# Patient Record
Sex: Male | Born: 1955 | Race: Black or African American | Hispanic: No | Smoking: Current every day smoker
Health system: Southern US, Community
[De-identification: ages and names within clinical notes are randomized; demographics above are authoritative.]

## PROBLEM LIST (undated history)

## (undated) DIAGNOSIS — R569 Unspecified convulsions: Secondary | ICD-10-CM

## (undated) DIAGNOSIS — I10 Essential (primary) hypertension: Secondary | ICD-10-CM

## (undated) DIAGNOSIS — H539 Unspecified visual disturbance: Secondary | ICD-10-CM

## (undated) DIAGNOSIS — R413 Other amnesia: Secondary | ICD-10-CM

## (undated) DIAGNOSIS — F1010 Alcohol abuse, uncomplicated: Secondary | ICD-10-CM

## (undated) DIAGNOSIS — R251 Tremor, unspecified: Secondary | ICD-10-CM

## (undated) HISTORY — DX: Other amnesia: R41.3

## (undated) HISTORY — DX: Unspecified visual disturbance: H53.9

## (undated) HISTORY — PX: NEPHRECTOMY LIVING DONOR: SUR877

---

## 1999-01-16 ENCOUNTER — Emergency Department (HOSPITAL_COMMUNITY): Admission: EM | Admit: 1999-01-16 | Discharge: 1999-01-16 | Payer: Self-pay | Admitting: Emergency Medicine

## 2000-12-23 ENCOUNTER — Emergency Department (HOSPITAL_COMMUNITY): Admission: EM | Admit: 2000-12-23 | Discharge: 2000-12-24 | Payer: Self-pay

## 2000-12-31 ENCOUNTER — Emergency Department (HOSPITAL_COMMUNITY): Admission: EM | Admit: 2000-12-31 | Discharge: 2000-12-31 | Payer: Self-pay | Admitting: Emergency Medicine

## 2002-01-01 ENCOUNTER — Emergency Department (HOSPITAL_COMMUNITY): Admission: EM | Admit: 2002-01-01 | Discharge: 2002-01-01 | Payer: Self-pay | Admitting: Emergency Medicine

## 2003-09-10 ENCOUNTER — Emergency Department (HOSPITAL_COMMUNITY): Admission: EM | Admit: 2003-09-10 | Discharge: 2003-09-10 | Payer: Self-pay | Admitting: Emergency Medicine

## 2004-02-28 ENCOUNTER — Inpatient Hospital Stay (HOSPITAL_COMMUNITY): Admission: EM | Admit: 2004-02-28 | Discharge: 2004-02-29 | Payer: Self-pay | Admitting: Emergency Medicine

## 2004-09-29 ENCOUNTER — Ambulatory Visit: Payer: Self-pay | Admitting: Family Medicine

## 2005-03-29 ENCOUNTER — Emergency Department (HOSPITAL_COMMUNITY): Admission: EM | Admit: 2005-03-29 | Discharge: 2005-03-29 | Payer: Self-pay | Admitting: Emergency Medicine

## 2005-05-23 ENCOUNTER — Ambulatory Visit: Payer: Self-pay | Admitting: Family Medicine

## 2005-07-17 ENCOUNTER — Ambulatory Visit: Payer: Self-pay | Admitting: Family Medicine

## 2005-09-20 ENCOUNTER — Ambulatory Visit: Payer: Self-pay | Admitting: Family Medicine

## 2005-10-03 ENCOUNTER — Ambulatory Visit: Payer: Self-pay | Admitting: Family Medicine

## 2005-11-30 ENCOUNTER — Emergency Department (HOSPITAL_COMMUNITY): Admission: EM | Admit: 2005-11-30 | Discharge: 2005-11-30 | Payer: Self-pay | Admitting: Emergency Medicine

## 2006-02-17 ENCOUNTER — Emergency Department (HOSPITAL_COMMUNITY): Admission: EM | Admit: 2006-02-17 | Discharge: 2006-02-17 | Payer: Self-pay | Admitting: Emergency Medicine

## 2006-05-11 ENCOUNTER — Ambulatory Visit (HOSPITAL_COMMUNITY): Admission: RE | Admit: 2006-05-11 | Discharge: 2006-05-11 | Payer: Self-pay | Admitting: Internal Medicine

## 2006-07-28 ENCOUNTER — Emergency Department (HOSPITAL_COMMUNITY): Admission: EM | Admit: 2006-07-28 | Discharge: 2006-07-28 | Payer: Self-pay | Admitting: Emergency Medicine

## 2006-12-17 ENCOUNTER — Emergency Department (HOSPITAL_COMMUNITY): Admission: EM | Admit: 2006-12-17 | Discharge: 2006-12-17 | Payer: Self-pay | Admitting: Emergency Medicine

## 2008-06-05 ENCOUNTER — Emergency Department (HOSPITAL_COMMUNITY): Admission: EM | Admit: 2008-06-05 | Discharge: 2008-06-05 | Payer: Self-pay | Admitting: Family Medicine

## 2008-07-29 ENCOUNTER — Emergency Department (HOSPITAL_COMMUNITY): Admission: EM | Admit: 2008-07-29 | Discharge: 2008-07-29 | Payer: Self-pay | Admitting: Emergency Medicine

## 2008-11-13 ENCOUNTER — Emergency Department (HOSPITAL_COMMUNITY): Admission: EM | Admit: 2008-11-13 | Discharge: 2008-11-13 | Payer: Self-pay | Admitting: Emergency Medicine

## 2009-08-05 ENCOUNTER — Emergency Department (HOSPITAL_COMMUNITY): Admission: EM | Admit: 2009-08-05 | Discharge: 2009-08-05 | Payer: Self-pay | Admitting: Emergency Medicine

## 2009-08-28 ENCOUNTER — Emergency Department (HOSPITAL_COMMUNITY): Admission: EM | Admit: 2009-08-28 | Discharge: 2009-08-28 | Payer: Self-pay | Admitting: Emergency Medicine

## 2009-09-07 ENCOUNTER — Emergency Department (HOSPITAL_COMMUNITY): Admission: EM | Admit: 2009-09-07 | Discharge: 2009-09-07 | Payer: Self-pay | Admitting: Emergency Medicine

## 2010-08-21 LAB — URINALYSIS, ROUTINE W REFLEX MICROSCOPIC
Bilirubin Urine: NEGATIVE
Glucose, UA: NEGATIVE mg/dL
Hgb urine dipstick: NEGATIVE
Ketones, ur: NEGATIVE mg/dL
Nitrite: NEGATIVE
Protein, ur: NEGATIVE mg/dL
Specific Gravity, Urine: 1.018 (ref 1.005–1.030)
Urobilinogen, UA: 1 mg/dL (ref 0.0–1.0)
pH: 6.5 (ref 5.0–8.0)

## 2010-08-29 LAB — PHENYTOIN LEVEL, TOTAL: Phenytoin Lvl: <2.5 ug/mL — ABNORMAL LOW (ref 10.0–20.0)
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
## 2010-10-20 ENCOUNTER — Emergency Department (HOSPITAL_COMMUNITY)
Admission: EM | Admit: 2010-10-20 | Discharge: 2010-10-20 | Disposition: A | Discharge status: Home / Self Care | Payer: Medicaid Other | Attending: Emergency Medicine | Admitting: Emergency Medicine

## 2010-10-20 DIAGNOSIS — M7989 Other specified soft tissue disorders: Secondary | ICD-10-CM | POA: Insufficient documentation

## 2010-10-20 DIAGNOSIS — L02419 Cutaneous abscess of limb, unspecified: Secondary | ICD-10-CM | POA: Insufficient documentation

## 2010-10-20 DIAGNOSIS — L03317 Cellulitis of buttock: Secondary | ICD-10-CM | POA: Insufficient documentation

## 2010-10-20 DIAGNOSIS — R21 Rash and other nonspecific skin eruption: Secondary | ICD-10-CM | POA: Insufficient documentation

## 2010-10-20 DIAGNOSIS — L02219 Cutaneous abscess of trunk, unspecified: Secondary | ICD-10-CM | POA: Insufficient documentation

## 2010-10-20 DIAGNOSIS — L03119 Cellulitis of unspecified part of limb: Secondary | ICD-10-CM | POA: Insufficient documentation

## 2010-10-20 DIAGNOSIS — G40909 Epilepsy, unspecified, not intractable, without status epilepticus: Secondary | ICD-10-CM | POA: Insufficient documentation

## 2010-10-20 DIAGNOSIS — L0231 Cutaneous abscess of buttock: Secondary | ICD-10-CM | POA: Insufficient documentation

## 2010-10-22 ENCOUNTER — Emergency Department (HOSPITAL_COMMUNITY)
Admission: EM | Admit: 2010-10-22 | Discharge: 2010-10-22 | Disposition: A | Discharge status: Home / Self Care | Payer: Medicaid Other | Attending: Emergency Medicine | Admitting: Emergency Medicine

## 2010-10-22 DIAGNOSIS — M7989 Other specified soft tissue disorders: Secondary | ICD-10-CM | POA: Insufficient documentation

## 2010-10-22 DIAGNOSIS — L0201 Cutaneous abscess of face: Secondary | ICD-10-CM | POA: Insufficient documentation

## 2010-10-22 DIAGNOSIS — L02419 Cutaneous abscess of limb, unspecified: Secondary | ICD-10-CM | POA: Insufficient documentation

## 2010-10-22 DIAGNOSIS — L03211 Cellulitis of face: Secondary | ICD-10-CM | POA: Insufficient documentation

## 2010-10-22 DIAGNOSIS — G40909 Epilepsy, unspecified, not intractable, without status epilepticus: Secondary | ICD-10-CM | POA: Insufficient documentation

## 2010-10-22 LAB — CULTURE, ROUTINE-ABSCESS: Gram Stain: NONE SEEN

## 2011-03-21 ENCOUNTER — Encounter: Payer: Self-pay | Admitting: *Deleted

## 2011-03-21 ENCOUNTER — Emergency Department (HOSPITAL_COMMUNITY)
Admission: EM | Admit: 2011-03-21 | Discharge: 2011-03-21 | Disposition: A | Discharge status: Home / Self Care | Payer: Medicaid Other | Attending: Emergency Medicine | Admitting: Emergency Medicine

## 2011-03-21 DIAGNOSIS — M7989 Other specified soft tissue disorders: Secondary | ICD-10-CM | POA: Insufficient documentation

## 2011-03-21 DIAGNOSIS — M79609 Pain in unspecified limb: Secondary | ICD-10-CM | POA: Insufficient documentation

## 2011-03-21 DIAGNOSIS — IMO0002 Reserved for concepts with insufficient information to code with codable children: Secondary | ICD-10-CM | POA: Insufficient documentation

## 2011-03-21 DIAGNOSIS — L02411 Cutaneous abscess of right axilla: Secondary | ICD-10-CM

## 2011-03-21 DIAGNOSIS — Z09 Encounter for follow-up examination after completed treatment for conditions other than malignant neoplasm: Secondary | ICD-10-CM | POA: Insufficient documentation

## 2011-03-21 MED ORDER — OXYCODONE-ACETAMINOPHEN 5-325 MG PO TABS
2.0000 | ORAL_TABLET | Freq: Once | ORAL | Status: AC
  Administered 2011-03-21: 2 via ORAL
  Filled 2011-03-21: qty 2

## 2011-03-21 MED ORDER — OXYCODONE-ACETAMINOPHEN 5-325 MG PO TABS: 1.0000 | ORAL_TABLET | ORAL | Status: AC | PRN

## 2011-03-21 MED ORDER — DOXYCYCLINE HYCLATE 100 MG PO CAPS: 100.0000 mg | ORAL_CAPSULE | Freq: Two times a day (BID) | ORAL | Status: AC

## 2011-03-21 MED ORDER — LIDOCAINE HCL 2 % IJ SOLN
5.0000 mL | Freq: Once | INTRAMUSCULAR | Status: AC
  Administered 2011-03-21: 100 mg

## 2011-03-21 NOTE — ED Provider Notes (Signed)
History     CSN: [id number] Arrival date & time: 03/21/2011 10:01 AM   First MD Initiated Contact with Patient 03/21/11 1004      Chief Complaint  Patient presents with  . Wound Check    boil under the right arm    (Consider location/radiation/quality/duration/timing/severity/associated sxs/prior treatment) Patient is a 55 y.o. male presenting with abscess. The history is provided by the patient. No language interpreter was used.  Abscess  This is a recurrent problem. The current episode started less than one week ago. The onset was gradual. The problem occurs rarely. The problem has been gradually worsening. Affected Location: right axilla. The problem is severe. The abscess is characterized by painfulness, swelling and redness. The abscess first occurred at home. Pertinent negatives include no fever, no vomiting, no sore throat and no cough. There were no sick contacts.    History reviewed. No pertinent past medical history.  Past Surgical History  Procedure Date  . Nephrectomy living donor     No family history on file.  History  Substance Use Topics  . Smoking status: Current Everyday Smoker  . Smokeless tobacco: Not on file  . Alcohol Use: Yes      Review of Systems  Constitutional: Negative for fever and chills.  HENT: Negative for ear pain, sore throat, neck pain and neck stiffness.   Eyes: Negative for pain and redness.  Respiratory: Negative for cough and shortness of breath.   Cardiovascular: Negative for chest pain and leg swelling.  Gastrointestinal: Negative for nausea, vomiting and abdominal pain.  Musculoskeletal: Negative for back pain and joint swelling.  Skin: Positive for color change and wound. Negative for rash.  Neurological: Negative for dizziness, weakness, numbness and headaches.  Hematological: Does not bruise/bleed easily.  Psychiatric/Behavioral: Negative for behavioral problems and confusion.    Allergies  Review of patient's allergies  indicates no known allergies.  Home Medications  No current outpatient prescriptions on file.  BP 135/94  Pulse 66  Temp(Src) 97.6 F (36.4 C) (Oral)  SpO2 97%  Physical Exam  Constitutional: He is oriented to person, place, and time. He appears well-developed and well-nourished. No distress.  HENT:  Head: Normocephalic and atraumatic.  Eyes: Pupils are equal, round, and reactive to light.  Neck: Normal range of motion. Neck supple.  Cardiovascular: Normal rate and regular rhythm.   Pulmonary/Chest: Effort normal.  Abdominal: Soft. He exhibits no distension. There is no tenderness.  Musculoskeletal: Normal range of motion. He exhibits no edema and no tenderness.  Neurological: He is alert and oriented to person, place, and time.  Skin: Skin is warm and dry.       2cm erythematous swollen abscess to posterior right axilla, small area of fluctuance to center  Psychiatric: His behavior is normal.    ED Course  Procedures (including critical care time) INCISION AND DRAINAGE Performed by: Lorenz Coaster Consent: Verbal consent obtained. Risks and benefits: risks, benefits and alternatives were discussed Timeout performed prior to procedure. Type: abscess  Body area: posterior right axilla  Anesthesia: local infiltration  Local anesthetic: lidocaine 2% without epinephrine  Anesthetic total: 1 ml  Complexity: complex Blunt dissection to break up loculations  Drainage: purulent  Drainage amount: small  Packing material: no packing material used- abscess pocket not large enough to warrant packing  Patient tolerance: Patient tolerated the procedure well with no immediate complications.     1. Abscess of axilla, right       MDM  55 y/o M with  abscess to right axilla- drained successfully. Given amount of surrounding erythema, suspect early cellulitis and will tx with abx.        Elwyn Reach Bland, [place] 03/21/11 1131

## 2011-03-21 NOTE — ED Notes (Signed)
Pt alert and oriented. Reports "I'm feeling better". NAD

## 2011-03-21 NOTE — ED Notes (Signed)
Patient reports he noticed a boil under his arm a few days ago.  He states he is also to have a chest xray

## 2011-03-22 NOTE — ED Provider Notes (Signed)
Medical screening examination/treatment/procedure(s) were performed by non-physician practitioner and as supervising physician I was immediately available for consultation/collaboration.   Nelia Shi, MD 03/22/11 [phone]

## 2011-05-20 ENCOUNTER — Encounter (HOSPITAL_COMMUNITY): Payer: Self-pay | Admitting: *Deleted

## 2011-05-20 ENCOUNTER — Emergency Department (HOSPITAL_COMMUNITY)
Admission: EM | Admit: 2011-05-20 | Discharge: 2011-05-20 | Disposition: A | Discharge status: Home / Self Care | Payer: Medicaid Other | Attending: Emergency Medicine | Admitting: Emergency Medicine

## 2011-05-20 DIAGNOSIS — R319 Hematuria, unspecified: Secondary | ICD-10-CM | POA: Insufficient documentation

## 2011-05-20 DIAGNOSIS — R300 Dysuria: Secondary | ICD-10-CM | POA: Insufficient documentation

## 2011-05-20 DIAGNOSIS — N390 Urinary tract infection, site not specified: Secondary | ICD-10-CM | POA: Insufficient documentation

## 2011-05-20 DIAGNOSIS — F17200 Nicotine dependence, unspecified, uncomplicated: Secondary | ICD-10-CM | POA: Insufficient documentation

## 2011-05-20 LAB — DIFFERENTIAL
Basophils Absolute: 0.1 K/uL (ref 0.0–0.1)
Basophils Relative: 1 % (ref 0–1)
Eosinophils Absolute: 0.2 K/uL (ref 0.0–0.7)
Eosinophils Relative: 2 % (ref 0–5)
Lymphocytes Relative: 20 % (ref 12–46)
Lymphs Abs: 2.1 K/uL (ref 0.7–4.0)
Monocytes Absolute: 0.6 K/uL (ref 0.1–1.0)
Monocytes Relative: 5 % (ref 3–12)
Neutro Abs: 7.4 K/uL (ref 1.7–7.7)
Neutrophils Relative %: 72 % (ref 43–77)

## 2011-05-20 LAB — BASIC METABOLIC PANEL
BUN: 12 mg/dL (ref 6–23)
CO2: 25 mEq/L (ref 19–32)
Calcium: 9.1 mg/dL (ref 8.4–10.5)
Chloride: 101 mEq/L (ref 96–112)
Creatinine, Ser: 1.14 mg/dL (ref 0.50–1.35)
GFR calc Af Amer: 83 mL/min — ABNORMAL LOW (ref >90)
GFR calc non Af Amer: 71 mL/min — ABNORMAL LOW (ref >90)
Glucose, Bld: 98 mg/dL (ref 70–99)
Potassium: 3.9 mEq/L (ref 3.5–5.1)
Sodium: 138 mEq/L (ref 135–145)

## 2011-05-20 LAB — CBC
HCT: 40.7 % (ref 39.0–52.0)
Hemoglobin: 13.9 g/dL (ref 13.0–17.0)
MCH: 29.3 pg (ref 26.0–34.0)
MCHC: 34.2 g/dL (ref 30.0–36.0)
MCV: 85.9 fL (ref 78.0–100.0)
Platelets: 230 10*3/uL (ref 150–400)
RBC: 4.74 MIL/uL (ref 4.22–5.81)
RDW: 14.1 % (ref 11.5–15.5)
WBC: 10.3 10*3/uL (ref 4.0–10.5)

## 2011-05-20 LAB — URINALYSIS, ROUTINE W REFLEX MICROSCOPIC
Bilirubin Urine: NEGATIVE
Glucose, UA: NEGATIVE mg/dL
Ketones, ur: NEGATIVE mg/dL
Nitrite: POSITIVE — AB
Protein, ur: 100 mg/dL — AB
Specific Gravity, Urine: 1.019 (ref 1.005–1.030)
Urobilinogen, UA: 1 mg/dL (ref 0.0–1.0)
pH: 7 (ref 5.0–8.0)

## 2011-05-20 LAB — URINE MICROSCOPIC-ADD ON

## 2011-05-20 MED ORDER — CIPROFLOXACIN HCL 500 MG PO TABS: 500.0000 mg | ORAL_TABLET | Freq: Two times a day (BID) | ORAL | Status: AC

## 2011-05-20 MED ORDER — DEXTROSE 5 % IV SOLN
1.0000 g | Freq: Once | INTRAVENOUS | Status: DC
  Filled 2011-05-20: qty 10

## 2011-05-20 MED ORDER — PHENAZOPYRIDINE HCL 200 MG PO TABS: 200.0000 mg | ORAL_TABLET | Freq: Three times a day (TID) | ORAL | Status: AC

## 2011-05-20 NOTE — ED Provider Notes (Signed)
History     CSN: [id number]  Arrival date & time 05/20/11  1626   First MD Initiated Contact with Patient 05/20/11 1928      Chief Complaint  Patient presents with  . Hematuria   HPI Issue presents to emergency room with complaints of dysuria and hematuria. Patient denies any flank pain or abdominal or back pain. Patient denies any fevers, nausea, vomiting. Patient denies any penile discharge. 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 No other complaints at this time. No weakness or confusion per her partner.  History reviewed. No pertinent past medical history.  Past Surgical History  Procedure Date  . Nephrectomy living donor     History reviewed. No pertinent family history.  History  Substance Use Topics  . Smoking status: Current Everyday Smoker  . Smokeless tobacco: Not on file  . Alcohol Use: Yes      Review of Systems  Constitutional: Negative for fever, chills, diaphoresis and appetite change.  HENT: Negative for neck pain.   Eyes: Negative for photophobia and visual disturbance.  Respiratory: Negative for cough, chest tightness and shortness of breath.   Cardiovascular: Negative for chest pain.  Gastrointestinal: Negative for nausea, vomiting and abdominal pain.  Genitourinary: Positive for dysuria and hematuria. Negative for urgency, frequency, flank pain, decreased urine volume, discharge, penile swelling, scrotal swelling, enuresis, difficulty urinating, genital sores, penile pain and testicular pain.  Musculoskeletal: Negative for back pain.  Skin: Negative for rash.  Neurological: Negative for weakness and numbness.  All other systems reviewed and are negative.    Allergies  Review of patient's allergies indicates no known allergies.  Home Medications  No current outpatient prescriptions on file.  BP 128/76  Pulse 74  Temp(Src) 98.7 F (37.1 C) (Oral)  Resp 16  SpO2 98%  Physical Exam  Nursing note and  vitals reviewed. Constitutional: He is oriented to person, place, and time. He appears well-developed and well-nourished.  Non-toxic appearance. He does not appear ill. No distress.  HENT:  Head: Normocephalic and atraumatic. No trismus in the jaw.  Right Ear: Tympanic membrane and external ear normal.  Left Ear: Tympanic membrane and external ear normal.  Nose: Nose normal. No nasal septal hematoma.  No foreign bodies.  Mouth/Throat: Oropharynx is clear and moist. No oropharyngeal exudate.  Eyes: EOM and lids are normal. Pupils are equal, round, and reactive to light. Right eye exhibits no discharge. Left eye exhibits no discharge. No scleral icterus.  Neck: Normal range of motion. Neck supple. No tracheal tenderness, no spinous process tenderness and no muscular tenderness present. No rigidity. No tracheal deviation and normal range of motion present.  Cardiovascular: Normal rate, regular rhythm, S1 normal, S2 normal, normal heart sounds and intact distal pulses.   Pulmonary/Chest: Effort normal and breath sounds normal. No stridor. No respiratory distress. He has no wheezes. He has no rales. He exhibits no tenderness.  Abdominal: Soft. Bowel sounds are normal. He exhibits no distension and no mass. There is no tenderness. There is no rebound and no guarding.  Genitourinary: Prostate normal and penis normal. No penile tenderness. No discharge found.       Chaperone present during examination.  Musculoskeletal: Normal range of motion. He exhibits no tenderness.  Neurological: He is alert and oriented to person, place, and time. He has normal strength and normal reflexes. No cranial nerve deficit or sensory deficit. He exhibits normal muscle tone. Coordination and gait normal. GCS eye subscore is  4. GCS verbal subscore is 5. GCS motor subscore is 6.  Skin: Skin is warm and dry. No rash noted. He is not diaphoretic. No erythema. No pallor.  Psychiatric: He has a normal mood and affect. His behavior  is normal. Judgment and thought content normal.    ED Course  Procedures (including critical care time)  Patient seen and evaluated.  VSS reviewed. . Nursing notes reviewed. Discussed with attending physician, Dr. Adriana Simas. Denies that any further testing or imaging is needed at this time. Advises repeat UA in 1 week, discussed with patient, stated agreement and understanding. Initial testing ordered. Will monitor the patient closely. They agree with the treatment plan and diagnosis.   Results for orders placed during the hospital encounter of 05/20/11  URINALYSIS, ROUTINE W REFLEX MICROSCOPIC      Component Value Range   Color, Urine YELLOW  YELLOW    APPearance TURBID (*) CLEAR    Specific Gravity, Urine 1.019  1.005 - 1.030    pH 7.0  5.0 - 8.0    Glucose, UA NEGATIVE  NEGATIVE (mg/dL)   Hgb urine dipstick LARGE (*) NEGATIVE    Bilirubin Urine NEGATIVE  NEGATIVE    Ketones, ur NEGATIVE  NEGATIVE (mg/dL)   Protein, ur 161 (*) NEGATIVE (mg/dL)   Urobilinogen, UA 1.0  0.0 - 1.0 (mg/dL)   Nitrite POSITIVE (*) NEGATIVE    Leukocytes, UA LARGE (*) NEGATIVE   URINE MICROSCOPIC-ADD ON      Component Value Range   Squamous Epithelial / LPF FEW (*) RARE    WBC, UA TOO NUMEROUS TO COUNT  <3 (WBC/hpf)   RBC / HPF TOO NUMEROUS TO COUNT  <3 (RBC/hpf)   Bacteria, UA MANY (*) RARE    Urine-Other MUCOUS PRESENT    BASIC METABOLIC PANEL      Component Value Range   Sodium 138  135 - 145 (mEq/L)   Potassium 3.9  3.5 - 5.1 (mEq/L)   Chloride 101  96 - 112 (mEq/L)   CO2 25  19 - 32 (mEq/L)   Glucose, Bld 98  70 - 99 (mg/dL)   BUN 12  6 - 23 (mg/dL)   Creatinine, Ser 0.96  0.50 - 1.35 (mg/dL)   Calcium 9.1  8.4 - 04.5 (mg/dL)   GFR calc non Af Amer 71 (*) >90 (mL/min)   GFR calc Af Amer 83 (*) >90 (mL/min)  CBC      Component Value Range   WBC 10.3  4.0 - 10.5 (K/uL)   RBC 4.74  4.22 - 5.81 (MIL/uL)   Hemoglobin 13.9  13.0 - 17.0 (g/dL)   HCT 40.9  81.1 - 91.4 (%)   MCV 85.9  78.0 - 100.0  (fL)   MCH 29.3  26.0 - 34.0 (pg)   MCHC 34.2  30.0 - 36.0 (g/dL)   RDW 78.2  95.6 - 21.3 (%)   Platelets 230  150 - 400 (K/uL)  DIFFERENTIAL      Component Value Range   Neutrophils Relative 72  43 - 77 (%)   Neutro Abs 7.4  1.7 - 7.7 (K/uL)   Lymphocytes Relative 20  12 - 46 (%)   Lymphs Abs 2.1  0.7 - 4.0 (K/uL)   Monocytes Relative 5  3 - 12 (%)   Monocytes Absolute 0.6  0.1 - 1.0 (K/uL)   Eosinophils Relative 2  0 - 5 (%)   Eosinophils Absolute 0.2  0.0 - 0.7 (K/uL)   Basophils Relative 1  0 - 1 (%)   Basophils Absolute 0.1  0.0 - 0.1 (K/uL)   No results found.  Patient seen and re-evaluated. Resting comfortably. VSS stable. NAD. Patient notified of testing results. Stated agreement and understanding. Patient stated understanding to treatment plan and diagnosis.     MDM  UTI, IV antibiotics given. Stressed importance of him returning in 1 week to PCP or ER for repeat UA. Stated agreement an understanding. NO renal insufficiency.         Demetrius Charity, PA 05/20/11 2325

## 2011-05-20 NOTE — ED Notes (Signed)
Patient noticed blood in his urine about two days ago.  Patient states that he has one kidney and denies any flank pain but did notice his lower back hurting this am

## 2011-05-21 NOTE — ED Provider Notes (Signed)
Medical screening examination/treatment/procedure(s) were performed by non-physician practitioner and as supervising physician I was immediately available for consultation/collaboration.  Donnetta Hutching, MD 05/21/11 [phone]

## 2011-05-22 LAB — URINE CULTURE
Colony Count: >=100000
Culture  Setup Time: 201301060018

## 2011-05-23 NOTE — ED Notes (Signed)
+   Urine Patient treated with Cipro-sensitive to same-chart appended per protocol MD. 

## 2012-02-16 ENCOUNTER — Emergency Department (HOSPITAL_COMMUNITY)
Admission: EM | Admit: 2012-02-16 | Discharge: 2012-02-16 | Disposition: A | Discharge status: Home / Self Care | Payer: Medicaid Other | Attending: Emergency Medicine | Admitting: Emergency Medicine

## 2012-02-16 ENCOUNTER — Encounter (HOSPITAL_COMMUNITY): Payer: Self-pay | Admitting: Emergency Medicine

## 2012-02-16 DIAGNOSIS — F17200 Nicotine dependence, unspecified, uncomplicated: Secondary | ICD-10-CM | POA: Insufficient documentation

## 2012-02-16 DIAGNOSIS — S46909A Unspecified injury of unspecified muscle, fascia and tendon at shoulder and upper arm level, unspecified arm, initial encounter: Secondary | ICD-10-CM | POA: Insufficient documentation

## 2012-02-16 DIAGNOSIS — S4980XA Other specified injuries of shoulder and upper arm, unspecified arm, initial encounter: Secondary | ICD-10-CM | POA: Insufficient documentation

## 2012-02-16 DIAGNOSIS — S4990XA Unspecified injury of shoulder and upper arm, unspecified arm, initial encounter: Secondary | ICD-10-CM

## 2012-02-16 DIAGNOSIS — Y998 Other external cause status: Secondary | ICD-10-CM | POA: Insufficient documentation

## 2012-02-16 DIAGNOSIS — Y93I9 Activity, other involving external motion: Secondary | ICD-10-CM | POA: Insufficient documentation

## 2012-02-16 MED ORDER — IBUPROFEN 600 MG PO TABS: 600.0000 mg | ORAL_TABLET | Freq: Three times a day (TID) | ORAL | Status: AC
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
## 2012-07-09 ENCOUNTER — Other Ambulatory Visit: Payer: Self-pay | Admitting: Internal Medicine

## 2012-07-09 DIAGNOSIS — M25511 Pain in right shoulder: Secondary | ICD-10-CM

## 2012-07-09 DIAGNOSIS — M25561 Pain in right knee: Secondary | ICD-10-CM

## 2012-07-19 ENCOUNTER — Other Ambulatory Visit: Payer: Medicaid Other

## 2012-08-01 ENCOUNTER — Other Ambulatory Visit: Payer: Medicaid Other

## 2012-08-09 ENCOUNTER — Other Ambulatory Visit: Payer: Medicaid Other

## 2014-01-06 ENCOUNTER — Emergency Department (HOSPITAL_COMMUNITY)
Admission: EM | Admit: 2014-01-06 | Discharge: 2014-01-06 | Disposition: A | Discharge status: Home / Self Care | Payer: Medicaid Other | Attending: Family Medicine | Admitting: Family Medicine

## 2014-01-06 ENCOUNTER — Encounter (HOSPITAL_COMMUNITY): Payer: Self-pay | Admitting: Emergency Medicine

## 2014-01-06 DIAGNOSIS — F17200 Nicotine dependence, unspecified, uncomplicated: Secondary | ICD-10-CM | POA: Diagnosis not present

## 2014-01-06 DIAGNOSIS — Z792 Long term (current) use of antibiotics: Secondary | ICD-10-CM | POA: Insufficient documentation

## 2014-01-06 DIAGNOSIS — H9209 Otalgia, unspecified ear: Secondary | ICD-10-CM | POA: Diagnosis present

## 2014-01-06 DIAGNOSIS — Z791 Long term (current) use of non-steroidal anti-inflammatories (NSAID): Secondary | ICD-10-CM | POA: Insufficient documentation

## 2014-01-06 DIAGNOSIS — I889 Nonspecific lymphadenitis, unspecified: Secondary | ICD-10-CM | POA: Insufficient documentation

## 2014-01-06 HISTORY — DX: Unspecified convulsions: R56.9

## 2014-01-06 MED ORDER — DICLOFENAC POTASSIUM 50 MG PO TABS
50.0000 mg | ORAL_TABLET | Freq: Three times a day (TID) | ORAL | Status: DC
Start: 2014-01-06 — End: 2014-01-26

## 2014-01-06 MED ORDER — CLINDAMYCIN HCL 300 MG PO CAPS: 300.0000 mg | ORAL_CAPSULE | Freq: Three times a day (TID) | ORAL | Status: DC

## 2014-01-06 NOTE — ED Provider Notes (Signed)
CSN: [id number]     Arrival date & time 01/06/14  2831 History   None    No chief complaint on file.    (Consider location/radiation/quality/duration/timing/severity/associated sxs/prior Treatment) Patient is a 58 y.o. male presenting with ear pain.  Otalgia Location:  Right Behind ear:  No abnormality Quality:  Sore and throbbing Severity:  Moderate Onset quality:  Sudden Duration:  2 days Progression:  Worsening Chronicity:  New Relieved by:  None tried Worsened by:  Nothing tried Associated symptoms: neck pain   Associated symptoms: no ear discharge, no fever and no sore throat     Past Medical History  Diagnosis Date  . Seizures    Past Surgical History  Procedure Laterality Date  . Nephrectomy living donor     No family history on file. History  Substance Use Topics  . Smoking status: Current Every Day Smoker  . Smokeless tobacco: Not on file  . Alcohol Use: Yes    Review of Systems  Constitutional: Negative.  Negative for fever.  HENT: Positive for ear pain and facial swelling. Negative for ear discharge and sore throat.   Musculoskeletal: Positive for neck pain.      Allergies  Review of patient's allergies indicates no known allergies.  Home Medications   Prior to Admission medications   Medication Sig Start Date End Date Taking? Authorizing Provider  clindamycin (CLEOCIN) 300 MG capsule Take 1 capsule (300 mg total) by mouth 3 (three) times daily. 01/06/14   Billy Fischer, MD  diclofenac (CATAFLAM) 50 MG tablet Take 1 tablet (50 mg total) by mouth 3 (three) times daily. For pain 01/06/14   Billy Fischer, MD   BP 147/91  Pulse 71  Temp(Src) 97.5 F (36.4 C) (Oral)  SpO2 100% Physical Exam  Nursing note and vitals reviewed. Constitutional: He is oriented to person, place, and time. He appears well-developed and well-nourished. No distress.  HENT:  Right Ear: External ear normal.  Left Ear: External ear normal.  Mouth/Throat: Oropharynx is  clear and moist.  Upper denture, lower many missing,post pharynx neg  Neck: Normal range of motion. Neck supple.  Tender parotid gland and assoc submand nodes on right.  Lymphadenopathy:    He has cervical adenopathy.  Neurological: He is alert and oriented to person, place, and time.  Skin: Skin is warm and dry.    ED Course  Procedures (including critical care time) Labs Review Labs Reviewed - No data to display  Imaging Review No results found.   EKG Interpretation None      MDM   Final diagnoses:  Cervical lymphadenitis        Billy Fischer, MD 01/06/14 [phone]

## 2014-01-06 NOTE — Discharge Instructions (Signed)
Take medicine as prescribed and see dr Simeon Craft if further problems.

## 2014-01-06 NOTE — ED Notes (Signed)
Started yesterday with tenderness to right submandibular area and right ear. Pt states he wears dentures.

## 2014-01-26 ENCOUNTER — Encounter (HOSPITAL_COMMUNITY): Payer: Self-pay | Admitting: Emergency Medicine

## 2014-01-26 ENCOUNTER — Emergency Department (HOSPITAL_COMMUNITY)
Admission: EM | Admit: 2014-01-26 | Discharge: 2014-01-26 | Disposition: A | Discharge status: Home / Self Care | Payer: Medicaid Other | Attending: Emergency Medicine | Admitting: Emergency Medicine

## 2014-01-26 ENCOUNTER — Emergency Department (HOSPITAL_COMMUNITY): Payer: Medicaid Other

## 2014-01-26 DIAGNOSIS — F17200 Nicotine dependence, unspecified, uncomplicated: Secondary | ICD-10-CM | POA: Insufficient documentation

## 2014-01-26 DIAGNOSIS — Z79899 Other long term (current) drug therapy: Secondary | ICD-10-CM | POA: Insufficient documentation

## 2014-01-26 DIAGNOSIS — F1410 Cocaine abuse, uncomplicated: Secondary | ICD-10-CM | POA: Diagnosis not present

## 2014-01-26 DIAGNOSIS — S51009A Unspecified open wound of unspecified elbow, initial encounter: Secondary | ICD-10-CM | POA: Diagnosis not present

## 2014-01-26 DIAGNOSIS — M25522 Pain in left elbow: Secondary | ICD-10-CM

## 2014-01-26 DIAGNOSIS — G40909 Epilepsy, unspecified, not intractable, without status epilepticus: Secondary | ICD-10-CM

## 2014-01-26 DIAGNOSIS — Z591 Inadequate housing, unspecified: Secondary | ICD-10-CM | POA: Insufficient documentation

## 2014-01-26 DIAGNOSIS — F1210 Cannabis abuse, uncomplicated: Secondary | ICD-10-CM | POA: Insufficient documentation

## 2014-01-26 DIAGNOSIS — Z791 Long term (current) use of non-steroidal anti-inflammatories (NSAID): Secondary | ICD-10-CM | POA: Insufficient documentation

## 2014-01-26 DIAGNOSIS — Z792 Long term (current) use of antibiotics: Secondary | ICD-10-CM | POA: Diagnosis not present

## 2014-01-26 DIAGNOSIS — S4980XA Other specified injuries of shoulder and upper arm, unspecified arm, initial encounter: Secondary | ICD-10-CM | POA: Insufficient documentation

## 2014-01-26 DIAGNOSIS — S51012A Laceration without foreign body of left elbow, initial encounter: Secondary | ICD-10-CM

## 2014-01-26 DIAGNOSIS — Y9389 Activity, other specified: Secondary | ICD-10-CM | POA: Diagnosis not present

## 2014-01-26 DIAGNOSIS — W1809XA Striking against other object with subsequent fall, initial encounter: Secondary | ICD-10-CM | POA: Diagnosis not present

## 2014-01-26 DIAGNOSIS — M25512 Pain in left shoulder: Secondary | ICD-10-CM

## 2014-01-26 DIAGNOSIS — F1920 Other psychoactive substance dependence, uncomplicated: Secondary | ICD-10-CM

## 2014-01-26 DIAGNOSIS — W19XXXA Unspecified fall, initial encounter: Secondary | ICD-10-CM

## 2014-01-26 DIAGNOSIS — S46909A Unspecified injury of unspecified muscle, fascia and tendon at shoulder and upper arm level, unspecified arm, initial encounter: Secondary | ICD-10-CM | POA: Insufficient documentation

## 2014-01-26 DIAGNOSIS — Y929 Unspecified place or not applicable: Secondary | ICD-10-CM | POA: Diagnosis not present

## 2014-01-26 LAB — RAPID URINE DRUG SCREEN, HOSP PERFORMED
Amphetamines: NOT DETECTED
BARBITURATES: NOT DETECTED
BENZODIAZEPINES: NOT DETECTED
Cocaine: POSITIVE — AB
Opiates: NOT DETECTED
Tetrahydrocannabinol: POSITIVE — AB

## 2014-01-26 LAB — CBC WITH DIFFERENTIAL/PLATELET
BASOS ABS: 0 10*3/uL (ref 0.0–0.1)
BASOS PCT: 1 % (ref 0–1)
EOS PCT: 2 % (ref 0–5)
Eosinophils Absolute: 0.1 10*3/uL (ref 0.0–0.7)
HCT: 44.8 % (ref 39.0–52.0)
Hemoglobin: 15 g/dL (ref 13.0–17.0)
LYMPHS PCT: 19 % (ref 12–46)
Lymphs Abs: 1.5 10*3/uL (ref 0.7–4.0)
MCH: 29.8 pg (ref 26.0–34.0)
MCHC: 33.5 g/dL (ref 30.0–36.0)
MCV: 88.9 fL (ref 78.0–100.0)
Monocytes Absolute: 0.7 10*3/uL (ref 0.1–1.0)
Monocytes Relative: 9 % (ref 3–12)
Neutro Abs: 5.3 10*3/uL (ref 1.7–7.7)
Neutrophils Relative %: 69 % (ref 43–77)
PLATELETS: 301 10*3/uL (ref 150–400)
RBC: 5.04 MIL/uL (ref 4.22–5.81)
RDW: 15.1 % (ref 11.5–15.5)
WBC: 7.6 10*3/uL (ref 4.0–10.5)

## 2014-01-26 LAB — BASIC METABOLIC PANEL
ANION GAP: 13 (ref 5–15)
BUN: 8 mg/dL (ref 6–23)
CALCIUM: 9.3 mg/dL (ref 8.4–10.5)
CO2: 23 meq/L (ref 19–32)
CREATININE: 1.03 mg/dL (ref 0.50–1.35)
Chloride: 105 mEq/L (ref 96–112)
GFR, EST NON AFRICAN AMERICAN: 79 mL/min — AB (ref >90)
Glucose, Bld: 105 mg/dL — ABNORMAL HIGH (ref 70–99)
Potassium: 4.7 mEq/L (ref 3.7–5.3)
SODIUM: 141 meq/L (ref 137–147)

## 2014-01-26 LAB — ETHANOL: ALCOHOL ETHYL (B): 65 mg/dL — AB (ref 0–11)

## 2014-01-26 MED ORDER — PHENYTOIN SODIUM EXTENDED 100 MG PO CAPS: 100.0000 mg | ORAL_CAPSULE | Freq: Three times a day (TID) | ORAL | Status: DC

## 2014-01-26 MED ORDER — TETANUS-DIPHTH-ACELL PERTUSSIS 5-2.5-18.5 LF-MCG/0.5 IM SUSP
0.5000 mL | Freq: Once | INTRAMUSCULAR | Status: AC
  Administered 2014-01-26: 0.5 mL via INTRAMUSCULAR
  Filled 2014-01-26: qty 0.5

## 2014-01-26 MED ORDER — IBUPROFEN 800 MG PO TABS
800.0000 mg | ORAL_TABLET | Freq: Once | ORAL | Status: AC
  Administered 2014-01-26: 800 mg via ORAL
  Filled 2014-01-26: qty 1
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
## 2014-01-26 NOTE — Discharge Instructions (Signed)
Finding Treatment for Alcohol and Drug Addiction It can be hard to find the right place to get professional treatment. Here are some important things to consider:  There are different types of treatment to choose from.  Some programs are live-in (residential) while others are not (outpatient). Sometimes a combination is offered.  No single type of program is right for everyone.  Most treatment programs involve a combination of education, counseling, and a 12-step, spiritually-based approach.  There are non-spiritually based programs (not 12-step).  Some treatment programs are government sponsored. They are geared for patients without private insurance.  Treatment programs can vary in many respects such as:  Cost and types of insurance accepted.  Types of on-site medical services offered.  Length of stay, setting, and size.  Overall philosophy of treatment. A person may need specialized treatment or have needs not addressed by all programs. For example, adolescents need treatment appropriate for their age. Other people have secondary disorders that must be managed as well. Secondary conditions can include mental illness, such as depression or diabetes. Often, a period of detoxification from alcohol or drugs is needed. This requires medical supervision and not all programs offer this. THINGS TO CONSIDER WHEN SELECTING A TREATMENT PROGRAM   Is the program certified by the appropriate government agency? Even private programs must be certified and employ certified professionals.  Does the program accept your insurance? If not, can a payment plan be set up?  Is the facility clean, organized, and well run? Do they allow you to speak with graduates who can share their treatment experience with you? Can you tour the facility? Can you meet with staff?  Does the program meet the full range of individual needs?  Does the treatment program address sexual orientation and physical disabilities?  Do they provide age, gender, and culturally appropriate treatment services?  Is treatment available in languages other than English?  Is long-term aftercare support or guidance encouraged and provided?  Is assessment of an individual's treatment plan ongoing to ensure it meets changing needs?  Does the program use strategies to encourage reluctant patients to remain in treatment long enough to increase the likelihood of success?  Does the program offer counseling (individual or group) and other behavioral therapies?  Does the program offer medicine as part of the treatment regimen, if needed?  Is there ongoing monitoring of possible relapse? Is there a defined relapse prevention program? Are services or referrals offered to family members to ensure they understand addiction and the recovery process? This would help them support the recovering individual.  Are 12-step meetings held at the center or is transport available for patients to attend outside meetings? In countries outside of the [place] and [place], Surveyor, minerals for contact information for services in your area. Document Released: 03/30/2005 Document Revised: 07/24/2011 Document Reviewed: 10/10/2007 [facility] Patient Information 2015 [place], [place]. This information is not intended to replace advice given to you by your health care provider. Make sure you discuss any questions you have with your health care provider.  Laceration Care, Adult A laceration is a cut or lesion that goes through all layers of the skin and into the tissue just beneath the skin. TREATMENT  Some lacerations may not require closure. Some lacerations may not be able to be closed due to an increased risk of infection. It is important to see your caregiver as soon as possible after an injury to minimize the risk of infection and maximize the opportunity for successful closure. If closure  is appropriate, pain medicines may be given, if needed. The wound will  be cleaned to help prevent infection. Your caregiver will use stitches (sutures), staples, wound glue (adhesive), or skin adhesive strips to repair the laceration. These tools bring the skin edges together to allow for faster healing and a better cosmetic outcome. However, all wounds will heal with a scar. Once the wound has healed, scarring can be minimized by covering the wound with sunscreen during the day for 1 full year. HOME CARE INSTRUCTIONS  For sutures or staples:  Keep the wound clean and dry.  If you were given a bandage (dressing), you should change it at least once a day. Also, change the dressing if it becomes wet or dirty, or as directed by your caregiver.  Wash the wound with soap and water 2 times a day. Rinse the wound off with water to remove all soap. Pat the wound dry with a clean towel.  After cleaning, apply a thin layer of the antibiotic ointment as recommended by your caregiver. This will help prevent infection and keep the dressing from sticking.  You may shower as usual after the first 24 hours. Do not soak the wound in water until the sutures are removed.  Only take over-the-counter or prescription medicines for pain, discomfort, or fever as directed by your caregiver.  Get your sutures or staples removed as directed by your caregiver. For skin adhesive strips:  Keep the wound clean and dry.  Do not get the skin adhesive strips wet. You may bathe carefully, using caution to keep the wound dry.  If the wound gets wet, pat it dry with a clean towel.  Skin adhesive strips will fall off on their own. You may trim the strips as the wound heals. Do not remove skin adhesive strips that are still stuck to the wound. They will fall off in time. For wound adhesive:  You may briefly wet your wound in the shower or bath. Do not soak or scrub the wound. Do not swim. Avoid periods of heavy perspiration until the skin adhesive has fallen off on its own. After showering or  bathing, gently pat the wound dry with a clean towel.  Do not apply liquid medicine, cream medicine, or ointment medicine to your wound while the skin adhesive is in place. This may loosen the film before your wound is healed.  If a dressing is placed over the wound, be careful not to apply tape directly over the skin adhesive. This may cause the adhesive to be pulled off before the wound is healed.  Avoid prolonged exposure to sunlight or tanning lamps while the skin adhesive is in place. Exposure to ultraviolet light in the first year will darken the scar.  The skin adhesive will usually remain in place for 5 to 10 days, then naturally fall off the skin. Do not pick at the adhesive film. You may need a tetanus shot if:  You cannot remember when you had your last tetanus shot.  You have never had a tetanus shot. If you get a tetanus shot, your arm may swell, get red, and feel warm to the touch. This is common and not a problem. If you need a tetanus shot and you choose not to have one, there is a rare chance of getting tetanus. Sickness from tetanus can be serious. SEEK MEDICAL CARE IF:   You have redness, swelling, or increasing pain in the wound.  You see a red line that goes away  from the wound.  You have yellowish-white fluid (pus) coming from the wound.  You have a fever.  You notice a bad smell coming from the wound or dressing.  Your wound breaks open before or after sutures have been removed.  You notice something coming out of the wound such as wood or glass.  Your wound is on your hand or foot and you cannot move a finger or toe. SEEK IMMEDIATE MEDICAL CARE IF:   Your pain is not controlled with prescribed medicine.  You have severe swelling around the wound causing pain and numbness or a change in color in your arm, hand, leg, or foot.  Your wound splits open and starts bleeding.  You have worsening numbness, weakness, or loss of function of any joint around or  beyond the wound.  You develop painful lumps near the wound or on the skin anywhere on your body. MAKE SURE YOU:   Understand these instructions.  Will watch your condition.  Will get help right away if you are not doing well or get worse. Document Released: 05/01/2005 Document Revised: 07/24/2011 Document Reviewed: 10/25/2010 [facility] Patient Information 2015 [place], [place]. This information is not intended to replace advice given to you by your health care provider. Make sure you discuss any questions you have with your health care provider.  Fall Prevention and Home Safety Falls cause injuries and can affect all age groups. It is possible to use preventive measures to significantly decrease the likelihood of falls. There are many simple measures which can make your home safer and prevent falls. OUTDOORS  Repair cracks and edges of walkways and driveways.  Remove high doorway thresholds.  Trim shrubbery on the main path into your home.  Have good outside lighting.  Clear walkways of tools, rocks, debris, and clutter.  Check that handrails are not broken and are securely fastened. Both sides of steps should have handrails.  Have leaves, snow, and ice cleared regularly.  Use sand or salt on walkways during winter months.  In the garage, clean up grease or oil spills. BATHROOM  Install night lights.  Install grab bars by the toilet and in the tub and shower.  Use non-skid mats or decals in the tub or shower.  Place a plastic non-slip stool in the shower to sit on, if needed.  Keep floors dry and clean up all water on the floor immediately.  Remove soap buildup in the tub or shower on a regular basis.  Secure bath mats with non-slip, double-sided rug tape.  Remove throw rugs and tripping hazards from the floors. BEDROOMS  Install night lights.  Make sure a bedside light is easy to reach.  Do not use oversized bedding.  Keep a telephone by your  bedside.  Have a firm chair with side arms to use for getting dressed.  Remove throw rugs and tripping hazards from the floor. KITCHEN  Keep handles on pots and pans turned toward the center of the stove. Use back burners when possible.  Clean up spills quickly and allow time for drying.  Avoid walking on wet floors.  Avoid hot utensils and knives.  Position shelves so they are not too high or low.  Place commonly used objects within easy reach.  If necessary, use a sturdy step stool with a grab bar when reaching.  Keep electrical cables out of the way.  Do not use floor polish or wax that makes floors slippery. If you must use wax, use non-skid floor wax.  Remove throw rugs and tripping hazards from the floor. STAIRWAYS  Never leave objects on stairs.  Place handrails on both sides of stairways and use them. Fix any loose handrails. Make sure handrails on both sides of the stairways are as long as the stairs.  Check carpeting to make sure it is firmly attached along stairs. Make repairs to worn or loose carpet promptly.  Avoid placing throw rugs at the top or bottom of stairways, or properly secure the rug with carpet tape to prevent slippage. Get rid of throw rugs, if possible.  Have an electrician put in a light switch at the top and bottom of the stairs. OTHER FALL PREVENTION TIPS  Wear low-heel or rubber-soled shoes that are supportive and fit well. Wear closed toe shoes.  When using a stepladder, make sure it is fully opened and both spreaders are firmly locked. Do not climb a closed stepladder.  Add color or contrast paint or tape to grab bars and handrails in your home. Place contrasting color strips on first and last steps.  Learn and use mobility aids as needed. Install an electrical emergency response system.  Turn on lights to avoid dark areas. Replace light bulbs that burn out immediately. Get light switches that glow.  Arrange furniture to create clear  pathways. Keep furniture in the same place.  Firmly attach carpet with non-skid or double-sided tape.  Eliminate uneven floor surfaces.  Select a carpet pattern that does not visually hide the edge of steps.  Be aware of all pets. OTHER HOME SAFETY TIPS  Set the water temperature for 120 F (48.8 C).  Keep emergency numbers on or near the telephone.  Keep smoke detectors on every level of the home and near sleeping areas. Document Released: 04/21/2002 Document Revised: 10/31/2011 Document Reviewed: 07/21/2011 [facility] Patient Information 2015 [place], [place]. This information is not intended to replace advice given to you by your health care provider. Make sure you discuss any questions you have with your health care provider.   Emergency Department Resource Guide 1) Find a Doctor and Pay Out of Pocket Although you won't have to find out who is covered by your insurance plan, it is a good idea to ask around and get recommendations. You will then need to call the office and see if the doctor you have chosen will accept you as a new patient and what types of options they offer for patients who are self-pay. Some doctors offer discounts or will set up payment plans for their patients who do not have insurance, but you will need to ask so you aren't surprised when you get to your appointment.  2) Contact Your Local Health Department Not all health departments have doctors that can see patients for sick visits, but many do, so it is worth a call to see if yours does. If you don't know where your local health department is, you can check in your phone book. The CDC also has a tool to help you locate your state's health department, and many state websites also have listings of all of their local health departments.  3) Find a [facility] If your illness is not likely to be very severe or complicated, you may want to try a walk in clinic. These are popping up all over the country in  pharmacies, drugstores, and shopping centers. They're usually staffed by nurse practitioners or physician assistants that have been trained to treat common illnesses and complaints. They're usually fairly quick and inexpensive. However,  if you have serious medical issues or chronic medical problems, these are probably not your best option.  No Primary Care Doctor: - Call Health Connect at  [phone] - they can help you locate a primary care doctor that  accepts your insurance, provides certain services, etc. - Physician Referral Service- [phone]  Chronic Pain Problems: Organization         Address  Phone   Notes  [facility]  [phone] Patients need to be referred by their primary care doctor.   Medication Assistance: Organization         Address  Phone   Notes  [facility] Medication [facility] [address] [place], [place] [address] [phone] --Must be a resident of [facility] -- Must have NO insurance coverage whatsoever (no Medicaid/ Medicare, etc.) -- The pt. MUST have a primary care doctor that directs their care regularly and follows them in the community   MedAssist  [phone]   [employer]  [phone]    Agencies that provide inexpensive medical care: Organization         Address  Phone   Notes  [place]  [phone]   [facility]    [phone]   [facility] [place] [facility] [place], [place] [address] [phone]   [place] [address], [place] [phone]   Planned Parenthood    [phone]   [facility]    [phone]   [place] and [place] [address], [place] Phone:  [phone], Fax:  [phone] Hours of Operation:  9 am - 6 pm, M-F.  Also accepts Medicaid/Medicare and self-pay.  [facility] for [place] [address], [address],  [place] Phone: [phone], Fax: [phone]. Hours of Operation:  8:30 am - 5:30 pm, M-F.  Also accepts Medicaid and self-pay.  [facility] [address], [place] Phone: [phone]   [place], [place], [place] [phone], Ext. [phone] Mondays & Thursdays: 7-9 AM.  First 15 patients are seen on a first come, first serve basis.    [place] Providers:  Organization         Address  Phone   Notes  [facility] [address],  [phone] Also accepts self-pay patients.  [facility] [address], [place]  [phone]   [place], [address], [place] [phone]   [facility] [address] [place] [phone]   Lucianne Lei [address], [place]   [phone] Only accepts [place] Access [place] patients after they have their name applied to their card.   Self-Pay (no insurance) in [place] [facility]:  Organization         Address  Phone   Notes  Sickle Cell Patients, [facility] [place] [phone]   [facility] [place] [phone]   [facility] [place]  [place], [place],  [phone]   [facility]/Dr. Osei-Bonsu  [address] [place] [address], [place] [phone] Phone number for both [facility] and  [place] locations is the same.  Urgent Medical and [facility] [address], [place] [phone]   [facility] [address], [place] or [address] [phone]   [facility] [address] [place] [phone], phone; [phone], fax Sees patients 1st and 3rd Saturday of every month.  Must not  qualify for public or private insurance (i.e. Medicaid, Medicare, [place] Health Choice, Veterans' Benefits)  Household income should be no more than 200% of the poverty level The clinic cannot treat you if you are pregnant or think you are pregnant  Sexually transmitted diseases are not treated at the clinic.    Dental Care: Organization         Address  Phone  Notes  [facility] Department of [facility] [phone] Accepts children up to age 73 who are enrolled in [place] or [place]; pregnant women with a Medicaid card; and children who have applied for Medicaid or [place] Health Choice, but were declined, whose parents can pay a reduced fee at time of service.  [facility] Department of [facility]  [address], [place] [phone] Accepts children up to age 81 who are enrolled in [place] or [place]; pregnant women with a Medicaid card; and children who have applied for Medicaid or [place] Health Choice, but were declined, whose parents can pay a reduced fee at time of service.  [place] Adult Dental Access PROGRAM  [place] [phone] Patients are seen by appointment only. Walk-ins are not accepted. [place] will see patients 64 years of age and older. Monday - Tuesday (8am-5pm) Most Wednesdays (8:30-5pm) $30 per visit, cash only  [facility] Adult Dental Access PROGRAM  [address], [facility] [phone] Patients are seen by appointment only. Walk-ins are not accepted. [place] will see patients 14 years of age and older. One Wednesday Evening (Monthly: Volunteer Based).  $30 per visit, cash only  [place]  [phone] for adults; Children under age 56, call Graduate Pediatric Dentistry at [phone]. Children aged 78-14, please call [phone] to request a pediatric application.  Dental services are provided  in all areas of dental care including fillings, crowns and bridges, complete and partial dentures, implants, gum treatment, root canals, and extractions. Preventive care is also provided. Treatment is provided to both adults and children. Patients are selected via a lottery and there is often a waiting list.   [facility] [address] [place]  [phone] [email or web address]   [facility] [address] [place], [place] [phone], Ext. [phone] Second and Fourth Thursday of each month, opens at 6:30 AM; Clinic ends at 9 AM.  Patients are seen on a first-come first-served basis, and a limited number are seen during each clinic.   [facility]  [address] Hillard Danker [place], [place] [phone]   Eligibility Requirements You must have lived in [place], [place], or [place] counties for at least the last three months.   You cannot be eligible for state or federal sponsored [employer], including [employer], [place], or [employer].   You generally cannot be eligible for healthcare insurance through your employer.    How to apply: Eligibility screenings are held every Tuesday and Wednesday afternoon from 1:00 pm until 4:00 pm. You do not  need an appointment for the interview!  [facility] [address], [place], [place]   [place]  [place] Department  [place]  [phone]    Behavioral Health Resources in the Community: Intensive Outpatient Programs Organization         Address  Phone  Notes  [place] [facility]. [address] [place], [place] [phone]   [facility] Outpatient [address] [place], [place]   ADS: Alcohol & Drug Svcs [address], [place], [place]   [place] [address],  [place], [place] or [phone]   Substance Abuse Resources Organization         Address  Phone  Notes  Alcohol and Drug Services  [phone]   [place]  [phone]   [facility] [place]   Chinita Pester  [phone]   Residential & Outpatient Substance Abuse Program  [phone]   Psychological Services Organization         Address  Phone  Notes  [facility] [place]  [place]  [phone]   [place] [address], [place] or [phone]    Mobile Crisis Teams Organization         Address  Phone  Notes  [facility], Mobile Crisis Care Unit  [phone]   [facility]  [address] [place], [place]   [place] Levels [address], [place] [phone]    Self-Help/Support Groups Organization         Address  Phone             Notes  [employer] of [place] - variety of support groups  [place] Call for more information  Narcotics Anonymous (NA), [facility] [address], [employer] [place]  2 meetings at this location   Special educational needs teacher         Address  Phone  Notes  [facility] [place],    [place]  [phone]   [facility]  [address] [place] [address], [place], [place]   [place], [place] [phone] Admissions: 8am-3pm M-F  Incentives Substance [place] [address]    [place], [place] [phone]   [facility] [address] [place], [place], [place]   [facility] [address]  [place], [place]   [facility] - Intensive Outpatient [address] Kristeen Mans [address], [place] Walk, [place]   [facility].,  [place], [place] [phone] or  [phone]   [facility]idential Treatment Services (RTS) [address], [place], [place] Accepts Medicaid  [facility] [place] [address],  [place] [phone] Substance Abuse/Addiction Treatment   [facility] Organization         Address  Phone  Notes  [facility]  [phone]   Domenic Schwab, PhD [address] Arlis Porta [place], [place]   [phone] or [phone]   [place], [place] [phone]   [place] [address] [place], [place] [phone] Insurance/Medicaid/sponsorship through [employer] and Families [address], [address]  [place], [place] [phone] [place], [place] [phone]    Dr. Adele Schilder  [phone]   [facility] Dept. 1) [address] [place] 2) [place] 3)  [address], [place] [phone]  [phone]   [place] [phone] or [phone] (After Hours)

## 2014-01-26 NOTE — ED Notes (Signed)
Pt also c/o of shoulder pain.

## 2014-01-27 NOTE — ED Provider Notes (Signed)
Medical screening examination/treatment/procedure(s) were performed by non-physician practitioner and as supervising physician I was immediately available for consultation/collaboration.   EKG Interpretation None        Ephraim Hamburger, MD 01/27/14 [phone]

## 2014-01-30 ENCOUNTER — Emergency Department (HOSPITAL_COMMUNITY): Payer: Medicaid Other

## 2014-01-30 ENCOUNTER — Emergency Department (HOSPITAL_COMMUNITY)
Admission: EM | Admit: 2014-01-30 | Discharge: 2014-01-31 | Disposition: A | Discharge status: Home / Self Care | Payer: Medicaid Other | Attending: Emergency Medicine | Admitting: Emergency Medicine

## 2014-01-30 ENCOUNTER — Encounter (HOSPITAL_COMMUNITY): Payer: Self-pay | Admitting: Emergency Medicine

## 2014-01-30 DIAGNOSIS — G40909 Epilepsy, unspecified, not intractable, without status epilepticus: Secondary | ICD-10-CM | POA: Diagnosis not present

## 2014-01-30 DIAGNOSIS — S0501XA Injury of conjunctiva and corneal abrasion without foreign body, right eye, initial encounter: Secondary | ICD-10-CM

## 2014-01-30 DIAGNOSIS — H1133 Conjunctival hemorrhage, bilateral: Secondary | ICD-10-CM

## 2014-01-30 DIAGNOSIS — Z791 Long term (current) use of non-steroidal anti-inflammatories (NSAID): Secondary | ICD-10-CM | POA: Insufficient documentation

## 2014-01-30 DIAGNOSIS — S0510XA Contusion of eyeball and orbital tissues, unspecified eye, initial encounter: Secondary | ICD-10-CM | POA: Diagnosis present

## 2014-01-30 DIAGNOSIS — H1130 Conjunctival hemorrhage, unspecified eye: Secondary | ICD-10-CM | POA: Insufficient documentation

## 2014-01-30 DIAGNOSIS — F17200 Nicotine dependence, unspecified, uncomplicated: Secondary | ICD-10-CM | POA: Diagnosis not present

## 2014-01-30 DIAGNOSIS — S298XXA Other specified injuries of thorax, initial encounter: Secondary | ICD-10-CM | POA: Diagnosis not present

## 2014-01-30 DIAGNOSIS — Z79899 Other long term (current) drug therapy: Secondary | ICD-10-CM | POA: Diagnosis not present

## 2014-01-30 DIAGNOSIS — S058X9A Other injuries of unspecified eye and orbit, initial encounter: Secondary | ICD-10-CM | POA: Insufficient documentation

## 2014-01-30 MED ORDER — TETRACAINE HCL 0.5 % OP SOLN
2.0000 [drp] | Freq: Once | OPHTHALMIC | Status: AC
  Administered 2014-01-31: 2 [drp] via OPHTHALMIC
  Filled 2014-01-30: qty 2

## 2014-01-30 MED ORDER — FLUORESCEIN SODIUM 1 MG OP STRP
1.0000 | ORAL_STRIP | Freq: Once | OPHTHALMIC | Status: AC
  Administered 2014-01-31: 1 via OPHTHALMIC
  Filled 2014-01-30: qty 1
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
## 2014-01-30 NOTE — ED Notes (Signed)
Eye box,  Eye drops and fluorescein strip at my desk ready for PA to use

## 2014-01-31 MED ORDER — HYDROCODONE-ACETAMINOPHEN 5-325 MG PO TABS: 1.0000 | ORAL_TABLET | Freq: Four times a day (QID) | ORAL | Status: DC | PRN

## 2014-01-31 MED ORDER — SULFACETAMIDE SODIUM 10 % OP SOLN: 2.0000 [drp] | OPHTHALMIC | Status: DC

## 2014-01-31 MED ORDER — EYE WASH OPHTH SOLN
1.0000 [drp] | OPHTHALMIC | Status: DC | PRN
  Administered 2014-01-31: 1 [drp] via OPHTHALMIC
  Filled 2014-01-31: qty 118

## 2014-01-31 NOTE — Discharge Instructions (Signed)
Return here as needed. Follow up with the eye doctor provided.  °
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
## 2014-02-01 NOTE — ED Provider Notes (Signed)
Medical screening examination/treatment/procedure(s) were conducted as a shared visit with non-physician practitioner(s) and myself.  I personally evaluated the patient during the encounter.   EKG Interpretation None        Houston Siren III, MD 02/01/14 [phone]

## 2014-02-09 ENCOUNTER — Emergency Department (INDEPENDENT_AMBULATORY_CARE_PROVIDER_SITE_OTHER)
Admission: EM | Admit: 2014-02-09 | Discharge: 2014-02-09 | Disposition: A | Discharge status: Home / Self Care | Payer: Medicaid Other | Source: Home / Self Care | Attending: Family Medicine | Admitting: Family Medicine

## 2014-02-09 ENCOUNTER — Encounter (HOSPITAL_COMMUNITY): Payer: Self-pay | Admitting: Emergency Medicine

## 2014-02-09 DIAGNOSIS — Z4802 Encounter for removal of sutures: Secondary | ICD-10-CM

## 2014-02-09 DIAGNOSIS — H1131 Conjunctival hemorrhage, right eye: Secondary | ICD-10-CM

## 2014-02-09 DIAGNOSIS — H1130 Conjunctival hemorrhage, unspecified eye: Secondary | ICD-10-CM

## 2014-02-09 NOTE — Discharge Instructions (Signed)
You are doing well The sutures were removed without any signs of infection Please follow up with your regular doctor as needed
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
## 2014-02-09 NOTE — ED Notes (Signed)
Suture removal from left elbow

## 2014-02-16 ENCOUNTER — Ambulatory Visit: Payer: Medicaid Other | Admitting: Internal Medicine

## 2014-03-27 ENCOUNTER — Emergency Department (HOSPITAL_COMMUNITY)
Admission: EM | Admit: 2014-03-27 | Discharge: 2014-03-27 | Disposition: A | Discharge status: Home / Self Care | Payer: Medicaid Other | Attending: Emergency Medicine | Admitting: Emergency Medicine

## 2014-03-27 ENCOUNTER — Encounter (HOSPITAL_COMMUNITY): Payer: Self-pay | Admitting: *Deleted

## 2014-03-27 DIAGNOSIS — Z79899 Other long term (current) drug therapy: Secondary | ICD-10-CM | POA: Insufficient documentation

## 2014-03-27 DIAGNOSIS — Z791 Long term (current) use of non-steroidal anti-inflammatories (NSAID): Secondary | ICD-10-CM | POA: Insufficient documentation

## 2014-03-27 DIAGNOSIS — L663 Perifolliculitis capitis abscedens: Secondary | ICD-10-CM | POA: Insufficient documentation

## 2014-03-27 DIAGNOSIS — Z720 Tobacco use: Secondary | ICD-10-CM | POA: Diagnosis not present

## 2014-03-27 DIAGNOSIS — Z792 Long term (current) use of antibiotics: Secondary | ICD-10-CM | POA: Diagnosis not present

## 2014-03-27 DIAGNOSIS — L739 Follicular disorder, unspecified: Secondary | ICD-10-CM

## 2014-03-27 DIAGNOSIS — R569 Unspecified convulsions: Secondary | ICD-10-CM | POA: Diagnosis not present

## 2014-03-27 NOTE — ED Notes (Signed)
Pt reports having small abscess/bump to right side of face for several days, denies any fever. Airway intact.

## 2014-03-27 NOTE — ED Provider Notes (Signed)
CSN: [id number]     Arrival date & time 03/27/14  1007 History  This chart was scribed for non-physician practitioner, Clayton Bibles, PA-C,working with Janice Norrie, MD, by Marlowe Kays, ED Scribe. This patient was seen in room TR06C/TR06C and the patient's care was started at 10:35 AM.  Chief Complaint  Patient presents with  . Abscess   Patient is a 58 y.o. male presenting with abscess. The history is provided by the patient. No language interpreter was used.  Abscess Associated symptoms: no fever     HPI Comments:  Brent Ramos is a 58 y.o. male with PMH of seizures who presents to the Emergency Department complaining of a spot to the right side of his face that began about 5-6 days ago. He states the area was sore but has been improving. He states he has been placing alcohol on the area and states that has helped decrease it in size. He denies sore throat, dental problems, trauma or injury to the area, fevers or chills.  Past Medical History  Diagnosis Date  . Seizures    Past Surgical History  Procedure Laterality Date  . Nephrectomy living donor     History reviewed. No pertinent family history. History  Substance Use Topics  . Smoking status: Current Every Day Smoker  . Smokeless tobacco: Not on file  . Alcohol Use: Yes     Comment: rarely     Review of Systems  Constitutional: Negative for fever and chills.  HENT: Negative for dental problem and sore throat.   Skin:       Abscess to right side of face.  All other systems reviewed and are negative.   Allergies  Review of patient's allergies indicates no known allergies.  Home Medications   Prior to Admission medications   Medication Sig Start Date End Date Taking? Authorizing Provider  diclofenac (CATAFLAM) 50 MG tablet Take 50 mg by mouth 3 (three) times daily.    Historical Provider, MD  HYDROcodone-acetaminophen (NORCO/VICODIN) 5-325 MG per tablet Take 1 tablet by mouth every 6 (six) hours as needed for  moderate pain. 01/31/14   Resa Miner Lawyer, PA-C  phenytoin (DILANTIN) 100 MG ER capsule Take 1 capsule (100 mg total) by mouth 3 (three) times daily. 01/26/14   Courtney A Forcucci, PA-C  sulfacetamide (BLEPH-10) 10 % ophthalmic solution Place 2 drops into both eyes every 4 (four) hours. 01/31/14   Brent General, PA-C   Triage Vitals: BP 163/81 mmHg  Pulse 87  Temp(Src) 97.7 F (36.5 C) (Oral)  Resp 18  SpO2 96% Physical Exam  Constitutional: He appears well-developed and well-nourished. No distress.  HENT:  Head: Normocephalic and atraumatic.  Right lower jaw line with superficial area of induration. No fluctuance, drainage or edema. Mild overlying erythema. No bony tenderness of mandible.  Neck: Neck supple.  Pulmonary/Chest: Effort normal.  Lymphadenopathy:    He has no cervical adenopathy.  No associated lymphadenopathy.  Neurological: He is alert.  Skin: He is not diaphoretic.  Nursing note and vitals reviewed.   ED Course  Procedures (including critical care time) DIAGNOSTIC STUDIES: Oxygen Saturation is 96% on RA, normal by my interpretation.   COORDINATION OF CARE: 10:39 AM-Informed pt to place warm compresses on the area. Pt verbalizes understanding and agrees to plan.  Medications - No data to display  Labs Review Labs Reviewed - No data to display  Imaging Review No results found.   EKG Interpretation None  MDM   Final diagnoses:  Folliculitis    Afebrile, nontoxic patient with small area of what is likely small early abscess over right side of face.  Course is improving.  No apparent drainable abscess on exam.  Recommend conservative therapy with warm moist compresses and continued monitoring.  PCP or ED follow up for worsening symptoms.   D/C home.  Discussed result, findings, treatment, and follow up  with patient.  Pt given return precautions.  Pt verbalizes understanding and agrees with plan.       I personally performed the services  described in this documentation, which was scribed in my presence. The recorded information has been reviewed and is accurate.    Clayton Bibles, PA-C 03/27/14 [place], MD 03/27/14 [phone]

## 2014-03-27 NOTE — Discharge Instructions (Signed)
Read the information below.  You may return to the Emergency Department at any time for worsening condition or any new symptoms that concern you.  If you develop redness, swelling, uncontrolled pain, or fevers greater than 100.4, return to the ER immediately for a recheck.     Folliculitis  Folliculitis is redness, soreness, and swelling (inflammation) of the hair follicles. This condition can occur anywhere on the body. People with weakened immune systems, diabetes, or obesity have a greater risk of getting folliculitis. CAUSES  Bacterial infection. This is the most common cause.  Fungal infection.  Viral infection.  Contact with certain chemicals, especially oils and tars. Long-term folliculitis can result from bacteria that live in the nostrils. The bacteria may trigger multiple outbreaks of folliculitis over time. SYMPTOMS Folliculitis most commonly occurs on the scalp, thighs, legs, back, buttocks, and areas where hair is shaved frequently. An early sign of folliculitis is a small, white or yellow, pus-filled, itchy lesion (pustule). These lesions appear on a red, inflamed follicle. They are usually less than 0.2 inches (5 mm) wide. When there is an infection of the follicle that goes deeper, it becomes a boil or furuncle. A group of closely packed boils creates a larger lesion (carbuncle). Carbuncles tend to occur in hairy, sweaty areas of the body. DIAGNOSIS  Your caregiver can usually tell what is wrong by doing a physical exam. A sample may be taken from one of the lesions and tested in a lab. This can help determine what is causing your folliculitis. TREATMENT  Treatment may include:  Applying warm compresses to the affected areas.  Taking antibiotic medicines orally or applying them to the skin.  Draining the lesions if they contain a large amount of pus or fluid.  Laser hair removal for cases of long-lasting folliculitis. This helps to prevent regrowth of the hair. HOME CARE  INSTRUCTIONS  Apply warm compresses to the affected areas as directed by your caregiver.  If antibiotics are prescribed, take them as directed. Finish them even if you start to feel better.  You may take over-the-counter medicines to relieve itching.  Do not shave irritated skin.  Follow up with your caregiver as directed. SEEK IMMEDIATE MEDICAL CARE IF:   You have increasing redness, swelling, or pain in the affected area.  You have a fever. MAKE SURE YOU:  Understand these instructions.  Will watch your condition.  Will get help right away if you are not doing well or get worse. Document Released: 07/10/2001 Document Revised: 10/31/2011 Document Reviewed: 08/01/2011 [facility] Patient Information 2015 [place], [place]. This information is not intended to replace advice given to you by your health care provider. Make sure you discuss any questions you have with your health care provider.

## 2015-05-08 ENCOUNTER — Encounter (HOSPITAL_COMMUNITY): Payer: Self-pay | Admitting: Emergency Medicine

## 2015-05-08 ENCOUNTER — Emergency Department (HOSPITAL_COMMUNITY)
Admission: EM | Admit: 2015-05-08 | Discharge: 2015-05-08 | Disposition: A | Discharge status: Home / Self Care | Payer: Medicaid Other | Attending: Emergency Medicine | Admitting: Emergency Medicine

## 2015-05-08 ENCOUNTER — Emergency Department (HOSPITAL_COMMUNITY): Payer: Medicaid Other

## 2015-05-08 DIAGNOSIS — W19XXXA Unspecified fall, initial encounter: Secondary | ICD-10-CM

## 2015-05-08 DIAGNOSIS — Z791 Long term (current) use of non-steroidal anti-inflammatories (NSAID): Secondary | ICD-10-CM | POA: Diagnosis not present

## 2015-05-08 DIAGNOSIS — Z79899 Other long term (current) drug therapy: Secondary | ICD-10-CM | POA: Insufficient documentation

## 2015-05-08 DIAGNOSIS — F17200 Nicotine dependence, unspecified, uncomplicated: Secondary | ICD-10-CM | POA: Insufficient documentation

## 2015-05-08 DIAGNOSIS — Y92096 Garden or yard of other non-institutional residence as the place of occurrence of the external cause: Secondary | ICD-10-CM | POA: Diagnosis not present

## 2015-05-08 DIAGNOSIS — S82402A Unspecified fracture of shaft of left fibula, initial encounter for closed fracture: Secondary | ICD-10-CM

## 2015-05-08 DIAGNOSIS — Y9389 Activity, other specified: Secondary | ICD-10-CM | POA: Diagnosis not present

## 2015-05-08 DIAGNOSIS — W010XXA Fall on same level from slipping, tripping and stumbling without subsequent striking against object, initial encounter: Secondary | ICD-10-CM | POA: Insufficient documentation

## 2015-05-08 DIAGNOSIS — Y990 Civilian activity done for income or pay: Secondary | ICD-10-CM | POA: Diagnosis not present

## 2015-05-08 DIAGNOSIS — S8992XA Unspecified injury of left lower leg, initial encounter: Secondary | ICD-10-CM | POA: Diagnosis present

## 2015-05-08 DIAGNOSIS — S99912A Unspecified injury of left ankle, initial encounter: Secondary | ICD-10-CM | POA: Insufficient documentation

## 2015-05-08 DIAGNOSIS — S8265XA Nondisplaced fracture of lateral malleolus of left fibula, initial encounter for closed fracture: Secondary | ICD-10-CM | POA: Diagnosis not present

## 2015-05-08 MED ORDER — OXYCODONE-ACETAMINOPHEN 5-325 MG PO TABS
2.0000 | ORAL_TABLET | Freq: Once | ORAL | Status: AC
  Administered 2015-05-08: 2 via ORAL
  Filled 2015-05-08: qty 2

## 2015-05-08 MED ORDER — IBUPROFEN 800 MG PO TABS
800.0000 mg | ORAL_TABLET | Freq: Three times a day (TID) | ORAL | Status: DC
Start: 2015-05-08 — End: 2016-02-18

## 2015-05-08 MED ORDER — OXYCODONE-ACETAMINOPHEN 5-325 MG PO TABS: 1.0000 | ORAL_TABLET | ORAL | Status: DC | PRN

## 2015-05-08 MED ORDER — OXYCODONE-ACETAMINOPHEN 5-325 MG PO TABS: 2.0000 | ORAL_TABLET | Freq: Once | ORAL | Status: DC

## 2015-05-08 MED ORDER — KETOROLAC TROMETHAMINE 60 MG/2ML IM SOLN
60.0000 mg | Freq: Once | INTRAMUSCULAR | Status: AC
Start: 2015-05-08 — End: 2015-05-08
  Administered 2015-05-08: 60 mg via INTRAMUSCULAR
  Filled 2015-05-08: qty 2
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
## 2015-05-08 NOTE — ED Provider Notes (Signed)
CSN: [id number]     Arrival date & time 05/08/15  1121 History  By signing my name below, I, Rayna Sexton, attest that this documentation has been prepared under the direction and in the presence of Shawn C. Joy, PA-C. Electronically Signed: Rayna Sexton, ED Scribe. 05/08/2015. 12:58 PM.    Chief Complaint  Patient presents with  . Fall  . Leg Injury   The history is provided by the patient. No language interpreter was used.   HPI Comments: MYKAL BEDNARCZYK is a 59 y.o. male who presents to the Emergency Department by ambulance due to a fall that occurred at 10:00 am. Pt notes that he slipped down ~4 steps outside. Pt complains of associated, 10/10, stinging, left ankle pain. Pt endorses worsening pain with movement of the affected joint and denies currently being able to bear weight with the left leg. He denies head trauma, LOC, dizziness, CP, SOB, neck pain, back pain, abd pain, hip pain, or any other pain, complaints, or injuries..   Past Medical History  Diagnosis Date  . Seizures [facility])    Past Surgical History  Procedure Laterality Date  . Nephrectomy living donor     No family history on file. Social History  Substance Use Topics  . Smoking status: Current Every Day Smoker  . Smokeless tobacco: None  . Alcohol Use: Yes     Comment: rarely     Review of Systems  Eyes: Negative for visual disturbance.  Respiratory: Negative for shortness of breath.   Cardiovascular: Negative for chest pain.  Gastrointestinal: Negative for nausea, vomiting and abdominal pain.  Genitourinary: Negative for flank pain and testicular pain.  Musculoskeletal: Positive for joint swelling and arthralgias. Negative for back pain and neck pain.  Skin: Negative for wound.  Neurological: Negative for dizziness, syncope and headaches.   Allergies  Review of patient's allergies indicates no known allergies.  Home Medications   Prior to Admission medications   Medication Sig Start Date End Date  Taking? Authorizing Provider  diclofenac (CATAFLAM) 50 MG tablet Take 50 mg by mouth 3 (three) times daily.    Historical Provider, MD  HYDROcodone-acetaminophen (NORCO/VICODIN) 5-325 MG per tablet Take 1 tablet by mouth every 6 (six) hours as needed for moderate pain. 01/31/14   Dalia Heading, PA-C  ibuprofen (ADVIL,MOTRIN) 800 MG tablet Take 1 tablet (800 mg total) by mouth 3 (three) times daily. 05/08/15   Shawn C Joy, PA-C  oxyCODONE-acetaminophen (PERCOCET/ROXICET) 5-325 MG tablet Take 1-2 tablets by mouth every 4 (four) hours as needed for severe pain. 05/08/15   Shawn C Joy, PA-C  phenytoin (DILANTIN) 100 MG ER capsule Take 1 capsule (100 mg total) by mouth 3 (three) times daily. 01/26/14   Courtney Forcucci, PA-C  sulfacetamide (BLEPH-10) 10 % ophthalmic solution Place 2 drops into both eyes every 4 (four) hours. 01/31/14   Christopher Lawyer, PA-C   BP 128/78 mmHg  Pulse 88  Temp(Src) 98 F (36.7 C) (Oral)  Resp 16  Ht 6\' 1"  (1.854 m)  Wt 95.255 kg  BMI 27.71 kg/m2  SpO2 99% Physical Exam  Constitutional: He is oriented to person, place, and time. He appears well-developed and well-nourished.  HENT:  Head: Normocephalic and atraumatic.  Mouth/Throat: No oropharyngeal exudate.  Eyes: Conjunctivae and EOM are normal. Pupils are equal, round, and reactive to light.  Neck: Normal range of motion. Neck supple.  Cardiovascular: Normal rate, regular rhythm, normal heart sounds and intact distal pulses.   Pulmonary/Chest: Effort normal and breath  sounds normal. No respiratory distress.  Abdominal: Soft. There is no tenderness.  Musculoskeletal: He exhibits edema and tenderness.  Left Leg: Tenderness, swelling, and erythema in the dorsal foot, anterior tibia and knee. Limited range of motion due to pain in the left ankle. Full body trauma assessment reveals no tenderness, deformity or crepitus other than noted above. Full range of motion in all other extremities and no paraspinal  tenderness.  Lymphadenopathy:    He has no cervical adenopathy.  Neurological: He is alert and oriented to person, place, and time. He has normal reflexes.  No sensory deficits. Strength 5/5 in all extremities. Cranial nerves III-XII grossly intact. No facial droop.  Skin: Skin is warm and dry. He is not diaphoretic.  Psychiatric: He has a normal mood and affect. His behavior is normal.  Nursing note and vitals reviewed.  ED Course  Procedures  DIAGNOSTIC STUDIES: Oxygen Saturation is 96% on RA, normal by my interpretation.    COORDINATION OF CARE: 12:54 PM Pt presents today due to a fall with associated left leg pain. Discussed next steps with pt including percocet and reevaluation based on imaging results.   Labs Review Labs Reviewed - No data to display  Imaging Review Dg Ribs Unilateral W/chest Left  05/08/2015  CLINICAL DATA:  Left-sided pain status post fall. EXAM: LEFT RIBS AND CHEST - 3+ VIEW COMPARISON:  01/30/2014 FINDINGS: No fracture or other bone lesions are seen involving the ribs. There is no evidence of pneumothorax or pleural effusion. Both lungs are clear. Heart size and mediastinal contours are within normal limits. IMPRESSION: Negative. Electronically Signed   By: Fidela Salisbury M.D.   On: 05/08/2015 12:51   Dg Tibia/fibula Left  05/08/2015  CLINICAL DATA:  Left ankle pain status post fall. EXAM: LEFT TIBIA AND FIBULA - 2 VIEW COMPARISON:  None. FINDINGS: There is a transverse fracture of the lateral malleolus with intra-articular extension. There is no significant displacement. There is associated soft tissue swelling. IMPRESSION: Transverse nondisplaced left lateral malleolar intraarticular fracture. Electronically Signed   By: Fidela Salisbury M.D.   On: 05/08/2015 12:49   Dg Foot Complete Left  05/08/2015  CLINICAL DATA:  Pt was out doing yard work when he slipped and fell causing injury to left leg and left rib area. Pt denies + LOC. Pt able to stand  and pivot. Pt with swelling to left leg/foot/ankle. Patient states it is painful to bear weight. EXAM: LEFT FOOT - COMPLETE 3+ VIEW COMPARISON:  None. FINDINGS: No fracture or dislocation of mid foot or forefoot. The phalanges are normal. The calcaneus is normal. No soft tissue abnormality. Healed fracture of the base of the fifth metatarsal. IMPRESSION: No fracture or dislocation. Electronically Signed   By: Suzy Bouchard M.D.   On: 05/08/2015 12:51   I have personally reviewed and evaluated these images as part of my medical decision-making.   EKG Interpretation None      MDM   Final diagnoses:  Fibula fracture, left, closed, initial encounter    Dishon L Hoey resents with left leg pain following a fall today.  Findings and plan of care discussed with Orlie Dakin, MD.  Patient has no evidence of other injuries than those noted and has pulse, motor, and sensory intact distal to the injuries. X-ray shows transverse nondisplaced left lateral malleolar intra-articular fracture. All other x-rays were without acute abnormalities. Patient's pain was controlled, he was placed in a short leg splint, and advised to follow-up with orthopedics outpatient as  soon as possible. Patient found to be unable to use crutches due to lack of strength that patient states is a chronic issue. Care manager consult was called and the proper paperwork was filled out for patient to receive a walker. Patient was also given instructions for home care and return precautions. Patient voiced understanding of these instructions, accepts the plan, and is comfortable with discharge.  I personally performed the services described in this documentation, which was scribed in my presence. The recorded information has been reviewed and is accurate.  Filed Vitals:   05/08/15 1126 05/08/15 1412  BP: 127/86 128/78  Pulse: 91 88  Temp: 98.7 F (37.1 C) 98 F (36.7 C)  TempSrc:  Oral  Resp: 18 16  Height: 6\' 1"  (1.854 m)    Weight: 95.255 kg   SpO2: 96% 99%     Lorayne Bender, PA-C 05/08/15 1838  Orlie Dakin, MD 05/09/15 1759

## 2015-05-08 NOTE — ED Notes (Signed)
PT has walker and demonstrated usage.

## 2015-05-08 NOTE — Discharge Instructions (Signed)
You have been seen today for a fall and leg pain. The x-ray showed a fracture in the side of your left ankle. Follow-up with orthopedics as soon as possible. Follow up with PCP as needed. Return to ED should symptoms worsen.   Emergency Department Resource Guide 1) Find a Doctor and Pay Out of Pocket Although you won't have to find out who is covered by your insurance plan, it is a good idea to ask around and get recommendations. You will then need to call the office and see if the doctor you have chosen will accept you as a new patient and what types of options they offer for patients who are self-pay. Some doctors offer discounts or will set up payment plans for their patients who do not have insurance, but you will need to ask so you aren't surprised when you get to your appointment.  2) Contact Your Local Health Department Not all health departments have doctors that can see patients for sick visits, but many do, so it is worth a call to see if yours does. If you don't know where your local health department is, you can check in your phone book. The CDC also has a tool to help you locate your state's health department, and many state websites also have listings of all of their local health departments.  3) Find a [facility] If your illness is not likely to be very severe or complicated, you may want to try a walk in clinic. These are popping up all over the country in pharmacies, drugstores, and shopping centers. They're usually staffed by nurse practitioners or physician assistants that have been trained to treat common illnesses and complaints. They're usually fairly quick and inexpensive. However, if you have serious medical issues or chronic medical problems, these are probably not your best option.  No Primary Care Doctor: - Call Health Connect at  [phone] - they can help you locate a primary care doctor that  accepts your insurance, provides certain services, etc. - Physician Referral  Service- [phone]  Chronic Pain Problems: Organization         Address  Phone   Notes  [facility]  [phone] Patients need to be referred by their primary care doctor.   Medication Assistance: Organization         Address  Phone   Notes  [facility] Medication [facility] [address] [place], [place] [address] [phone] --Must be a resident of [facility] -- Must have NO insurance coverage whatsoever (no Medicaid/ Medicare, etc.) -- The pt. MUST have a primary care doctor that directs their care regularly and follows them in the community   MedAssist  [phone]   [employer]  [phone]    Agencies that provide inexpensive medical care: Organization         Address  Phone   Notes  [place]  [phone]   [facility]    [phone]   [facility] [place], [place] [address] [phone]   [place] [address] [place] [phone]   Planned Parenthood    [phone]   [facility]    [phone]   [place] and [address], [place] Phone:  [phone], Fax:  [phone] Hours of Operation:  9 am - 6 pm, M-F.  Also accepts  Medicaid/Medicare and self-pay.  [facility] for [address], [address], [place] Phone: [phone], Fax: [phone]. Hours of Operation:  8:30 am - 5:30 pm, M-F.  Also accepts Medicaid and self-pay.  [facility] [place] [address] [place] Phone: [phone]   [place], [place], [place] [phone], Ext. [phone] Mondays & Thursdays: 7-9 AM.  First 15 patients are seen on a first come, first serve basis.    [place] Providers:  Organization         Address  Phone   Notes  [facility] [address], [place] [phone] Also accepts self-pay patients.  [facility] [id number] [place], [place]  [phone]   [place], [address], [place] [phone]   [facility] [address] [place] [phone]   Lucianne Lei [address], [place]   [phone] Only accepts [place] Access [place] patients after they have their name applied to their card.   Self-Pay (no insurance) in [facility]:  Organization         Address  Phone   Notes  Sickle Cell Patients, [facility] [place] [phone]   [facility] [place] [phone]   [facility] [place]  [place], [place], [place] [phone]   [facility]/Dr. Osei-Bonsu  [address] [place] or [address], [place] [phone] Phone number for both [place] and [place] locations is the same.  Urgent Medical and [facility] [address], [place] [phone]   [facility] [address], [place] or [address] [phone]   [facility] [address], [place] [phone], phone; [phone], fax Sees patients 1st and 3rd Saturday of every month.  Must not qualify for public or private insurance (i.e. Medicaid, Medicare, [place] Health Choice, Veterans' Benefits)  Household income should be no more than 200% of the poverty level The clinic cannot treat you if you are pregnant or think you are pregnant  Sexually transmitted diseases are not treated at the clinic.    Dental Care: Organization         Address  Phone  Notes  [facility] of [facility] [place] [phone] Accepts children up to age 63 who are enrolled in  [place] or [place]; pregnant women with a Medicaid card; and children who have applied for Medicaid or [place] Health Choice, but were declined, whose parents can pay a reduced fee at time of service.  [facility] Department of [facility]  [address], [place] [phone] Accepts children up to age 39 who are enrolled in [place] or [place]; pregnant women with a Medicaid card; and children who have applied for Medicaid or [place] Health Choice, but were declined, whose parents can pay a reduced fee at time of service.  [place] Adult Dental Access PROGRAM  [place] [phone] Patients are seen by appointment only. Walk-ins are not accepted. [place] will see patients 85 years of age and older. Monday - Tuesday (8am-5pm) Most  Wednesdays (8:30-5pm) $30 per visit, cash only  [facility] Adult Dental Access PROGRAM  [address], [facility] [phone] Patients are seen by appointment only. Walk-ins are not accepted. [place] will see patients 50 years of age and older. One Wednesday Evening (Monthly: Volunteer Based).  $30 per visit, cash only  [place]  [phone] for adults; Children under age 85, call Graduate Pediatric Dentistry at [phone]. Children aged 4-14, please call [phone] to request a pediatric application.  Dental services are provided in all areas of dental care including fillings, crowns and bridges, complete and partial dentures, implants, gum treatment, root canals, and extractions. Preventive care is also provided. Treatment is provided to both adults and children. Patients are selected via a lottery and there is often a waiting list.   [facility] [address], [place]  [phone] [email or web address]   [facility] [address] [place], [place] [phone], Ext. [phone] Second and Fourth Thursday of each month, opens at 6:30  AM; Clinic ends at 9 AM.  Patients are seen on a first-come first-served basis, and a limited number are seen during each clinic.   [facility]  [address] [place], [place] [phone]   Eligibility Requirements You must have lived in [place], [place], or [place] counties for at least the last three months.   You cannot be eligible for state or federal sponsored [employer], including [employer], [place], or [employer].   You generally cannot be eligible for healthcare insurance through your employer.    How to apply: Eligibility screenings are held every Tuesday and Wednesday afternoon from 1:00 pm until 4:00 pm. You do not need an appointment for the interview!  [facility] [address], [place], [place]   [place]  [place] Department  [place]  [phone]    Behavioral Health Resources in the Community: Intensive Outpatient Programs Organization         Address  Phone  Notes  [place]. [address] [place], [place] [phone]   [facility] [address] [place], [place]   ADS: Alcohol & Drug Svcs [address] [place], [place]   [place] [address]  [place], [place] or [phone]   Substance Abuse Resources Organization         Address  Phone  Notes  Alcohol and Drug Services  [phone]   [place]  [phone]   The [place]   Chinita Pester  [phone]   Residential & Outpatient Substance Abuse Program  [phone]   Psychological Services Organization         Address  Phone  Notes  [facility]  [place]  [phone]   [place] [address] [place] or  [phone]    Mobile Crisis Teams Organization         Address  Phone  Notes  [facility] Alternatives, Mobile Crisis Care Unit  [phone]   Assertive Psychotherapeutic Services  [address] [place], [place]   [place] Levels [address] [place] [phone]    Self-Help/Support Groups Organization         Address  Phone  Notes  [facility] of [place] - variety of support groups  [place] Call for more information  Narcotics Anonymous (NA), [facility] [address], [employer] [place]  2 meetings at this location   Special educational needs teacher         Address  Phone  Notes  ASAP Residential Treatment [place],    [place]  [phone]   [facility]  [address] [place] [phone], [place], [place]   [place], [place] [phone] Admissions: 8am-3pm M-F  Incentives Substance [place] [address]    [place], [place] [phone]   [facility] [address] [place], [place], [place]   [facility] [address],  [place], [place]   [facility]ight Programs - Intensive Outpatient [address] Kristeen [address], [place], [place]   [facility] [address]  [place], [place] [phone] or [phone]   [facility]idential Treatment Services (RTS) [address] [place], [place] Accepts Medicaid  [facility] [place] [address],  [place] [phone] Substance Abuse/Addiction Treatment   [facility] The Organization         Address  Phone  Notes  [facility]  [phone]   Domenic Schwab, PhD [address] Arlis Port[place], [place]   [phone] or [phone]   [place] and [place], [place] [phone]   [facility] [address],  [place], [place] [phone] Insurance/Medicaid/sponsorship through [facility] and Families [address], [address] [place]                                    [place], [place] [phone] [place] [address] [place], [place] [phone]    Dr. Adele Schilder  [phone]   [facility] Dept. 1) [address], [place] 2) [place] 3)  [place] [address], [place] [phone]  [phone]   [place] [phone] or [phone] (After Hours)

## 2015-05-08 NOTE — Progress Notes (Signed)
Orthopedic Tech Progress Note Patient Details:  Brent Ramos [birth year] [id number]  Ortho Devices Type of Ortho Device: Ace wrap, Post (short leg) splint Ortho Device/Splint Location: lle Ortho Device/Splint Interventions: Application Pt unable to use crutches; RN notified  [name] 05/08/2015, 2:28 PM

## 2015-05-08 NOTE — ED Notes (Signed)
Pt was out doing yard work when he slipped and fell causing injury to left leg and left rib area. Pt denies + LOC. Pt able to stand and pivot. Pt with swelling to left leg/foot/ankle.

## 2016-02-18 ENCOUNTER — Encounter (HOSPITAL_COMMUNITY): Payer: Self-pay | Admitting: Emergency Medicine

## 2016-02-18 ENCOUNTER — Emergency Department (HOSPITAL_COMMUNITY): Payer: Medicaid Other

## 2016-02-18 ENCOUNTER — Emergency Department (HOSPITAL_COMMUNITY)
Admission: EM | Admit: 2016-02-18 | Discharge: 2016-02-18 | Disposition: A | Discharge status: Home / Self Care | Payer: Medicaid Other | Attending: Emergency Medicine | Admitting: Emergency Medicine

## 2016-02-18 DIAGNOSIS — F17200 Nicotine dependence, unspecified, uncomplicated: Secondary | ICD-10-CM | POA: Diagnosis not present

## 2016-02-18 DIAGNOSIS — S8991XA Unspecified injury of right lower leg, initial encounter: Secondary | ICD-10-CM | POA: Diagnosis present

## 2016-02-18 DIAGNOSIS — Y999 Unspecified external cause status: Secondary | ICD-10-CM | POA: Insufficient documentation

## 2016-02-18 DIAGNOSIS — X509XXA Other and unspecified overexertion or strenuous movements or postures, initial encounter: Secondary | ICD-10-CM | POA: Insufficient documentation

## 2016-02-18 DIAGNOSIS — S83411A Sprain of medial collateral ligament of right knee, initial encounter: Secondary | ICD-10-CM | POA: Diagnosis not present

## 2016-02-18 DIAGNOSIS — Y939 Activity, unspecified: Secondary | ICD-10-CM | POA: Insufficient documentation

## 2016-02-18 DIAGNOSIS — Y929 Unspecified place or not applicable: Secondary | ICD-10-CM | POA: Diagnosis not present

## 2016-02-18 LAB — CBC WITH DIFFERENTIAL/PLATELET
BASOS PCT: 0 %
Basophils Absolute: 0 10*3/uL (ref 0.0–0.1)
EOS ABS: 0.2 10*3/uL (ref 0.0–0.7)
Eosinophils Relative: 2 %
HEMATOCRIT: 42.8 % (ref 39.0–52.0)
HEMOGLOBIN: 13.9 g/dL (ref 13.0–17.0)
LYMPHS ABS: 1.7 10*3/uL (ref 0.7–4.0)
Lymphocytes Relative: 23 %
MCH: 28.5 pg (ref 26.0–34.0)
MCHC: 32.5 g/dL (ref 30.0–36.0)
MCV: 87.7 fL (ref 78.0–100.0)
MONO ABS: 0.5 10*3/uL (ref 0.1–1.0)
MONOS PCT: 7 %
NEUTROS ABS: 4.9 10*3/uL (ref 1.7–7.7)
NEUTROS PCT: 68 %
Platelets: 258 10*3/uL (ref 150–400)
RBC: 4.88 MIL/uL (ref 4.22–5.81)
RDW: 14.8 % (ref 11.5–15.5)
WBC: 7.2 10*3/uL (ref 4.0–10.5)

## 2016-02-18 LAB — BASIC METABOLIC PANEL
Anion gap: 8 (ref 5–15)
BUN: 7 mg/dL (ref 6–20)
CALCIUM: 9.4 mg/dL (ref 8.9–10.3)
CHLORIDE: 106 mmol/L (ref 101–111)
CO2: 28 mmol/L (ref 22–32)
CREATININE: 1.03 mg/dL (ref 0.61–1.24)
GFR calc Af Amer: >60 mL/min (ref >60)
GFR calc non Af Amer: >60 mL/min (ref >60)
Glucose, Bld: 105 mg/dL — ABNORMAL HIGH (ref 65–99)
Potassium: 4.1 mmol/L (ref 3.5–5.1)
Sodium: 142 mmol/L (ref 135–145)

## 2016-02-18 MED ORDER — HYDROCODONE-ACETAMINOPHEN 5-325 MG PO TABS: 1.0000 | ORAL_TABLET | Freq: Four times a day (QID) | ORAL | 0 refills | Status: DC | PRN

## 2016-02-18 MED ORDER — IBUPROFEN 800 MG PO TABS: 800.0000 mg | ORAL_TABLET | Freq: Three times a day (TID) | ORAL | 0 refills | Status: DC

## 2016-02-18 NOTE — ED Triage Notes (Signed)
Arrived via EMS. Onset one day ago stood up heard a pop and twisted right knee. Pain continued today today with swelling left lateral knee.

## 2016-02-18 NOTE — ED Provider Notes (Signed)
[facility] DEPT Provider Note   CSN: [id number] Arrival date & time: 02/18/16  1155   By signing my name below, I, Evelene Croon, attest that this documentation has been prepared under the direction and in the presence of non-physician practitioner, Jerzee Jerome Abbott Pao, PA-C . Electronically Signed: Evelene Croon, Scribe. 02/18/2016. 12:38 PM.   History   Chief Complaint Chief Complaint  Patient presents with  . Knee Pain    The history is provided by the patient. No language interpreter was used.     HPI Comments:  Brent Ramos is a 60 y.o. male who presents to the Emergency Department complaining of 9/10 right knee pain since yesterday. Pt states when he stood up, twisted the knee, and felt a pop. Pt reports  associated swelling to the knee. He denies h/o injury to the same knee. His pain is exacerbated when he ambulates. He has applied ice without relief; no medications taken PTA. No h/o DVT.   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  He denies fever, dysuria, and hematuria.   Past Medical History:  Diagnosis Date  . Seizures ([place])     There are no active problems to display for this patient.   Past Surgical History:  Procedure Laterality Date  . NEPHRECTOMY LIVING DONOR        Home Medications    Prior to Admission medications   Medication Sig Start Date End Date Taking? Authorizing Provider  diclofenac (CATAFLAM) 50 MG tablet Take 50 mg by mouth 3 (three) times daily.    Historical Provider, MD  HYDROcodone-acetaminophen (NORCO/VICODIN) 5-325 MG per tablet Take 1 tablet by mouth every 6 (six) hours as needed for moderate pain. 01/31/14   Dalia Heading, PA-C  ibuprofen (ADVIL,MOTRIN) 800 MG tablet Take 1 tablet (800 mg total) by mouth 3 (three) times daily. 05/08/15   Shawn C Joy, PA-C  oxyCODONE-acetaminophen (PERCOCET/ROXICET) 5-325 MG tablet Take 1-2  tablets by mouth every 4 (four) hours as needed for severe pain. 05/08/15   Shawn C Joy, PA-C  phenytoin (DILANTIN) 100 MG ER capsule Take 1 capsule (100 mg total) by mouth 3 (three) times daily. 01/26/14   Courtney Forcucci, PA-C  sulfacetamide (BLEPH-10) 10 % ophthalmic solution Place 2 drops into both eyes every 4 (four) hours. 01/31/14   Dalia Heading, PA-C    Family History No family history on file.  Social History Social History  Substance Use Topics  . Smoking status: Current Every Day Smoker  . Smokeless tobacco: Never Used  . Alcohol use Yes     Comment: rarely      Allergies   Review of patient's allergies indicates no known allergies.   Review of Systems Review of Systems  Constitutional: Negative for fever.  Genitourinary: Negative for dysuria and hematuria.  Musculoskeletal: Positive for arthralgias (knee pain).  Neurological: Negative for weakness.     Physical Exam Updated Vital Signs BP 107/75 (BP Location: Right Arm)   Pulse 66   Temp 97.5 F (36.4 C) (Oral)   Resp 16   Ht 6\' 2"  (1.88 m)   Wt 220 lb (99.8 kg)   SpO2 97%   BMI 28.25 kg/m   Physical Exam  Constitutional: He is oriented to person, place, and time. He appears well-developed and well-nourished. No distress.  HENT:  Head: Normocephalic and atraumatic.  Eyes: Conjunctivae are normal.  Cardiovascular: Normal rate.  Pulmonary/Chest: Effort normal.  Abdominal: He exhibits no distension.  Musculoskeletal:  Right knee with limited ROM secondary to pain. TTP and swelling noted to medial joint line, but no effusion. Mild bruising to medial aspect of knee, but no obvious deformity, crepitus, or warmth. No abnormal alignment or patellar mobility, no varus/valgus laxity, neg anterior drawer test, no crepitus.  Strength and sensation grossly intact, distal pulses intact, compartments soft  Neurological: He is alert and oriented to person, place, and time.  Skin: Skin is warm and dry.    Psychiatric: He has a normal mood and affect.  Nursing note and vitals reviewed.    ED Treatments / Results  DIAGNOSTIC STUDIES:  Oxygen Saturation is 97% on RA, normal by my interpretation.    COORDINATION OF CARE:  12:34 PM Discussed treatment plan with pt at bedside and pt agreed to plan.  Labs (all labs ordered are listed, but only abnormal results are displayed) Labs Reviewed  CBC WITH DIFFERENTIAL/PLATELET  BASIC METABOLIC PANEL    EKG  EKG Interpretation None       Radiology No results found.  Procedures Procedures (including critical care time)  Medications Ordered in ED Medications - No data to display   Initial Impression / Assessment and Plan / ED Course  I have reviewed the triage vital signs and the nursing notes.  Pertinent labs & imaging results that were available during my care of the patient were reviewed by me and considered in my medical decision making (see chart for details).  Clinical Course   Patient X-Ray negative for obvious fracture or dislocation.  Pt advised to follow up with orthopedics and given contact information to schedule appointment. Patient given knee immobilizer and crutches while in ED, conservative therapy recommended and discussed. BMP in ED unremarkable with creatinine 1.03. No concerns for acute kidney injury, and patient reassured of findings. Advised to f/u with PCP and given contact information for [facility] to establish care. Patient will be discharged home & is agreeable with above plan. Returns precautions discussed. Pt appears safe for discharge.   Final Clinical Impressions(s) / ED Diagnoses   Final diagnoses:  Sprain of medial collateral ligament of right knee, initial encounter    New Prescriptions New Prescriptions   No medications on file    I personally performed the services described in this documentation, which was scribed in my presence. The recorded information has been reviewed  and is accurate.   [place], [place] 02/18/16 1332    Leo Grosser, MD 02/19/16 [phone]

## 2016-02-18 NOTE — ED Notes (Signed)
Called phone number provided by pt. They are unable to pick up patient. Social work consulted.

## 2016-02-18 NOTE — ED Notes (Signed)
Pt requesting cab ride home. Charge nurse notified.

## 2016-02-18 NOTE — ED Notes (Signed)
Pt returned from xray

## 2016-02-18 NOTE — ED Notes (Signed)
Pt given drink and snacks.

## 2016-02-18 NOTE — ED Notes (Signed)
Pt verbalized understanding of d/c instructions. Pt's friend Gregary Signs called pt a cab and pt wheeled out to the waiting room to wait for cab.

## 2016-02-18 NOTE — ED Notes (Signed)
Pt in radiology 

## 2016-02-18 NOTE — ED Notes (Signed)
Patient added wanted to get his kidney checked because he donated his kidney sister long time ago.

## 2016-02-18 NOTE — ED Notes (Signed)
See PA assessment 

## 2016-02-18 NOTE — ED Notes (Signed)
Pt asked to remain seated in the wheelchair, due to his knee pain.

## 2016-02-18 NOTE — ED Notes (Addendum)
Pt returned from xray

## 2016-03-20 ENCOUNTER — Emergency Department (HOSPITAL_COMMUNITY)
Admission: EM | Admit: 2016-03-20 | Discharge: 2016-03-20 | Disposition: A | Discharge status: Home / Self Care | Payer: Medicaid Other | Attending: Emergency Medicine | Admitting: Emergency Medicine

## 2016-03-20 ENCOUNTER — Encounter (HOSPITAL_COMMUNITY): Payer: Self-pay

## 2016-03-20 DIAGNOSIS — Y929 Unspecified place or not applicable: Secondary | ICD-10-CM | POA: Insufficient documentation

## 2016-03-20 DIAGNOSIS — S0991XA Unspecified injury of ear, initial encounter: Secondary | ICD-10-CM | POA: Diagnosis present

## 2016-03-20 DIAGNOSIS — Y999 Unspecified external cause status: Secondary | ICD-10-CM | POA: Diagnosis not present

## 2016-03-20 DIAGNOSIS — S00432A Contusion of left ear, initial encounter: Secondary | ICD-10-CM | POA: Diagnosis not present

## 2016-03-20 DIAGNOSIS — W228XXA Striking against or struck by other objects, initial encounter: Secondary | ICD-10-CM | POA: Diagnosis not present

## 2016-03-20 DIAGNOSIS — F17200 Nicotine dependence, unspecified, uncomplicated: Secondary | ICD-10-CM | POA: Diagnosis not present

## 2016-03-20 DIAGNOSIS — Y939 Activity, unspecified: Secondary | ICD-10-CM | POA: Diagnosis not present

## 2016-03-20 MED ORDER — LIDOCAINE HCL (PF) 1 % IJ SOLN: 5.0000 mL | Freq: Once | INTRAMUSCULAR | Status: DC

## 2016-03-20 MED ORDER — LIDOCAINE HCL (PF) 1 % IJ SOLN
30.0000 mL | Freq: Once | INTRAMUSCULAR | Status: AC
  Administered 2016-03-20: 30 mL via INTRADERMAL
  Filled 2016-03-20: qty 30

## 2016-03-20 NOTE — ED Provider Notes (Signed)
[facility] DEPT Provider Note   CSN: [id number] Arrival date & time: 03/20/16  1157     History   Chief Complaint Chief Complaint  Patient presents with  . Otalgia    HPI Brent Ramos is a 60 y.o. male. He presents violation of left ear pain and swelling. He has a history of seizures. He states he is compliant with his Dilantin. He can describe his dosage and frequency of his medicine. He states that 10 days ago he may have had a seizure at his home. He states his roommate told him that he had fallen and he seemed a little confused. He has not had any episodes since. He's noticed some pain and swelling of his left ear since that time and presents for evaluation.  HPI  Past Medical History:  Diagnosis Date  . Seizures ([place])     There are no active problems to display for this patient.   Past Surgical History:  Procedure Laterality Date  . NEPHRECTOMY LIVING DONOR         Home Medications    Prior to Admission medications   Medication Sig Start Date End Date Taking? Authorizing Provider  diclofenac (CATAFLAM) 50 MG tablet Take 50 mg by mouth 3 (three) times daily.    Historical Provider, MD  HYDROcodone-acetaminophen (NORCO/VICODIN) 5-325 MG tablet Take 1 tablet by mouth every 6 (six) hours as needed for moderate pain. 02/18/16   Nicole Pisciotta, PA-C  ibuprofen (ADVIL,MOTRIN) 800 MG tablet Take 1 tablet (800 mg total) by mouth 3 (three) times daily. 02/18/16   Nicole Pisciotta, PA-C  oxyCODONE-acetaminophen (PERCOCET/ROXICET) 5-325 MG tablet Take 1-2 tablets by mouth every 4 (four) hours as needed for severe pain. 05/08/15   Shawn C Joy, PA-C  phenytoin (DILANTIN) 100 MG ER capsule Take 1 capsule (100 mg total) by mouth 3 (three) times daily. 01/26/14   Courtney Forcucci, PA-C  sulfacetamide (BLEPH-10) 10 % ophthalmic solution Place 2 drops into both eyes every 4 (four) hours. 01/31/14   Dalia Heading, PA-C    Family History History reviewed. No pertinent  family history.  Social History Social History  Substance Use Topics  . Smoking status: Current Every Day Smoker  . Smokeless tobacco: Never Used  . Alcohol use Yes     Comment: rarely      Allergies   Patient has no known allergies.   Review of Systems Review of Systems  Constitutional: Negative for appetite change, chills, diaphoresis, fatigue and fever.  HENT: Positive for ear pain. Negative for mouth sores, sore throat and trouble swallowing.   Eyes: Negative for visual disturbance.  Respiratory: Negative for cough, chest tightness, shortness of breath and wheezing.   Cardiovascular: Negative for chest pain.  Gastrointestinal: Negative for abdominal distention, abdominal pain, diarrhea, nausea and vomiting.  Endocrine: Negative for polydipsia, polyphagia and polyuria.  Genitourinary: Negative for dysuria, frequency and hematuria.  Musculoskeletal: Negative for gait problem.  Skin: Negative for color change, pallor and rash.  Neurological: Positive for seizures. Negative for dizziness, syncope, light-headedness and headaches.  Hematological: Does not bruise/bleed easily.  Psychiatric/Behavioral: Negative for behavioral problems and confusion.     Physical Exam Updated Vital Signs BP 129/81 (BP Location: Left Arm)   Pulse 67   Temp 98 F (36.7 C) (Oral)   Resp 20   SpO2 98%   Physical Exam  Constitutional: He is oriented to person, place, and time. He appears well-developed and well-nourished. No distress.  HENT:  Head: Normocephalic.  Ears:  Mild bilateral subconjunctival hemorrhages. No hematotympanum  Eyes: Conjunctivae are normal. Pupils are equal, round, and reactive to light. No scleral icterus.  Neck: Normal range of motion. Neck supple. No thyromegaly present.  Cardiovascular: Normal rate and regular rhythm.  Exam reveals no gallop and no friction rub.   No murmur heard. Pulmonary/Chest: Effort normal and breath sounds normal. No respiratory distress.  He has no wheezes. He has no rales.  Abdominal: Soft. Bowel sounds are normal. He exhibits no distension. There is no tenderness. There is no rebound.  Musculoskeletal: Normal range of motion.  Neurological: He is alert and oriented to person, place, and time.  Skin: Skin is warm and dry. No rash noted.  Psychiatric: He has a normal mood and affect. His behavior is normal.     ED Treatments / Results  Labs (all labs ordered are listed, but only abnormal results are displayed) Labs Reviewed - No data to display  EKG  EKG Interpretation None       Radiology No results found.  Procedures Procedures (including critical care time)  Medications Ordered in ED Medications  lidocaine (PF) (XYLOCAINE) 1 % injection 30 mL (30 mLs Intradermal Given 03/20/16 1337)     Initial Impression / Assessment and Plan / ED Course  I have reviewed the triage vital signs and the nursing notes.  Pertinent labs & imaging results that were available during my care of the patient were reviewed by me and considered in my medical decision making (see chart for details).  Clinical Course     INCISION AND DRAINAGE Performed by: Lolita Patella Consent: Verbal consent obtained. Risks and benefits: risks, benefits and alternatives were discussed Type: abscess  Body area: Left ear auricular hematoma  Anesthesia: local infiltration  Incision was made with a scalpel.  Local anesthetic: lidocaine 1% v epinephrine  Anesthetic total: 2 ml  Complexity: complex Blunt dissection to break up loculations  Drainage: purulent  Drainage amount: 3-4 ml blood and clots, irrigated and massaged until clots evacuated and clear effluent   Packing material: 1/4 in iodoform gauze. Wick placed. Form fitting saline dampened gauze placed into patella. 4 x 4's over, and behind ear.   Patient tolerance: Patient tolerated the procedure well with no immediate complications.     Final Clinical  Impressions(s) / ED Diagnoses   Final diagnoses:  Hematoma of left ear, initial encounter   Form fitting gauze and Ace bandage placed. Patient asked to recheck in 48 hours here.  Patient warned that after 2 weeks at very likely he will have some compromise cartilage in my cleaned up with a deformed/"cauliflower ear"   New Prescriptions New Prescriptions   No medications on file     Tanna Furry, MD 03/20/16 1411

## 2016-03-20 NOTE — ED Triage Notes (Signed)
Pt has redness and swelling to his left ear. Pt has hx of seizures and thinks he may have had a seizure and fell and hit his ear.

## 2016-03-20 NOTE — Discharge Instructions (Signed)
Leave dressing and wrap in place.  Recheck here in 48 hours.  If dressing is wet, falling off, or soaked with blood, return tomorrow.

## 2016-10-07 ENCOUNTER — Emergency Department (HOSPITAL_COMMUNITY)
Admission: EM | Admit: 2016-10-07 | Discharge: 2016-10-08 | Disposition: A | Discharge status: Home / Self Care | Payer: Medicaid Other | Attending: Emergency Medicine | Admitting: Emergency Medicine

## 2016-10-07 ENCOUNTER — Encounter (HOSPITAL_COMMUNITY): Payer: Self-pay | Admitting: Emergency Medicine

## 2016-10-07 DIAGNOSIS — G40909 Epilepsy, unspecified, not intractable, without status epilepticus: Secondary | ICD-10-CM

## 2016-10-07 DIAGNOSIS — F17200 Nicotine dependence, unspecified, uncomplicated: Secondary | ICD-10-CM | POA: Diagnosis not present

## 2016-10-07 DIAGNOSIS — F10929 Alcohol use, unspecified with intoxication, unspecified: Secondary | ICD-10-CM | POA: Insufficient documentation

## 2016-10-07 DIAGNOSIS — R569 Unspecified convulsions: Secondary | ICD-10-CM | POA: Diagnosis present

## 2016-10-07 DIAGNOSIS — F10920 Alcohol use, unspecified with intoxication, uncomplicated: Secondary | ICD-10-CM

## 2016-10-07 DIAGNOSIS — Z79899 Other long term (current) drug therapy: Secondary | ICD-10-CM | POA: Diagnosis not present

## 2016-10-07 MED ORDER — SODIUM CHLORIDE 0.9 % IV SOLN
1000.0000 mg | Freq: Once | INTRAVENOUS | Status: AC
  Administered 2016-10-08: 1000 mg via INTRAVENOUS
  Filled 2016-10-07: qty 10

## 2016-10-07 MED ORDER — M.V.I. ADULT IV INJ
INJECTION | Freq: Once | INTRAVENOUS | Status: AC
  Administered 2016-10-08: 01:00:00 via INTRAVENOUS
  Filled 2016-10-07: qty 1000

## 2016-10-07 NOTE — ED Provider Notes (Signed)
[facility] DEPT Provider Note   CSN: [id number] Arrival date & time: 10/07/16  2302  By signing my name below, I, Brent Ramos, attest that this documentation has been prepared under the direction and in the presence of physician practitioner, Horton, Barbette Hair, MD. Electronically Signed: Dora Ramos, Scribe. 10/07/2016. 11:40 PM.  History   Chief Complaint Chief Complaint  Patient presents with  . Seizures  . Alcohol Intoxication   The history is provided by the patient. No language interpreter was used.    HPI Comments: LEVEL 5 CAVEAT FOR ALTERED MENTAL STATUS Brent Ramos is a 61 y.o. male who presents to the Emergency Department via EMS for evaluation of Reported 3 witnessed grand mal seizures that occurred shortly PTA. He states he does not remember seizing but his friend witnessed his seizures and subsequently called EMS. He was recently switched from Dilantin to [place]; he did not take his dose of Keppra tonight but has otherwise taken it as instructed. Patient also endorses drinking two 40 oz beers sometime tonight and states he does not drink alcohol every day. He denies abdominal pain, myalgias/arthralgias, or any other complaints at this time.  Patient provides minimal history. He is somnolent during history taking and does not remember events leading to transport.  Past Medical History:  Diagnosis Date  . Seizures ([place])     There are no active problems to display for this patient.   Past Surgical History:  Procedure Laterality Date  . NEPHRECTOMY LIVING DONOR         Home Medications    Prior to Admission medications   Medication Sig Start Date End Date Taking? Authorizing Provider  brimonidine (ALPHAGAN) 0.2 % ophthalmic solution Place 1 drop into both eyes 3 (three) times daily.   Yes [provider]  dorzolamide (TRUSOPT) 2 % ophthalmic solution Place 1 drop into both eyes 3 (three) times daily.   Yes [provider]    HYDROcodone-acetaminophen (NORCO/VICODIN) 5-325 MG tablet Take 1 tablet by mouth every 6 (six) hours as needed for moderate pain. 02/18/16  Yes Pisciotta, Elmyra Ricks, PA-C  ibuprofen (ADVIL,MOTRIN) 800 MG tablet Take 1 tablet (800 mg total) by mouth 3 (three) times daily. Patient taking differently: Take 800 mg by mouth 3 (three) times daily as needed for moderate pain.  02/18/16  Yes Pisciotta, Elmyra Ricks, PA-C  levETIRAcetam (KEPPRA) 500 MG tablet Take 500 mg by mouth 2 (two) times daily.   Yes [provider]  oxyCODONE-acetaminophen (PERCOCET/ROXICET) 5-325 MG tablet Take 1-2 tablets by mouth every 4 (four) hours as needed for severe pain. 05/08/15  Yes Joy, Shawn C, PA-C  phenytoin (DILANTIN) 100 MG ER capsule Take 1 capsule (100 mg total) by mouth 3 (three) times daily. Patient taking differently: Take 100 mg by mouth 2 (two) times daily.  01/26/14  Yes Forcucci, Courtney, PA-C  timolol (TIMOPTIC) 0.5 % ophthalmic solution Place 1 drop into both eyes 2 (two) times daily.   Yes [provider]    Family History No family history on file.  Social History Social History  Substance Use Topics  . Smoking status: Current Every Day Smoker  . Smokeless tobacco: Never Used  . Alcohol use Yes     Comment: rarely      Allergies   Patient has no known allergies.   Review of Systems Review of Systems  Unable to perform ROS: Mental status change   Physical Exam Updated Vital Signs BP (!) 130/92   Pulse 94   Temp  98.2 F (36.8 C) (Oral)   Resp 17   SpO2 98%   Physical Exam  Constitutional:  Chronically ill-appearing, appears older than stated age  HENT:  Head: Normocephalic and atraumatic.  Eyes:  Pupils uneven with chronically dilated right pupil, left pupil 7 mm and reactive  Cardiovascular: Normal rate, regular rhythm and normal heart sounds.   Pulmonary/Chest: Effort normal and breath sounds normal. No respiratory distress. He has no wheezes.  Abdominal: Soft.  Bowel sounds are normal. There is no tenderness. There is no rebound.  Musculoskeletal: He exhibits no edema.  Neurological: He is alert.  Oriented to person and time, follow simple commands, 5 out of 5 strength in all 4 extremities, somnolent but arousable  Skin: Skin is warm and dry.  Psychiatric: He has a normal mood and affect.  Nursing note and vitals reviewed.  ED Treatments / Results  Labs (all labs ordered are listed, but only abnormal results are displayed) Labs Reviewed  CBC WITH DIFFERENTIAL/PLATELET - Abnormal; Notable for the following:       Result Value   Hemoglobin 12.6 (*)    HCT 38.7 (*)    All other components within normal limits  BASIC METABOLIC PANEL - Abnormal; Notable for the following:    CO2 21 (*)    Calcium 8.5 (*)    All other components within normal limits  ETHANOL - Abnormal; Notable for the following:    Alcohol, Ethyl (B) 52 (*)    All other components within normal limits    EKG  EKG Interpretation  Date/Time:  Saturday Oct 07 2016 23:03:00 EDT Ventricular Rate:  97 PR Interval:    QRS Duration: 83 QT Interval:  379 QTC Calculation: 482 R Axis:   63 Text Interpretation:  Sinus rhythm Probable left atrial enlargement RSR' in V1 or V2, probably normal variant Minimal ST elevation, anterior leads Borderline prolonged QT interval No prior for comparison Confirmed by Thayer Jew [phone]) on 10/07/2016 11:21:45 PM       Radiology No results found.  Procedures Procedures (including critical care time)  DIAGNOSTIC STUDIES: Oxygen Saturation is 96% on RA, adequate by my interpretation.    Medications Ordered in ED Medications  levETIRAcetam (KEPPRA) 1,000 mg in sodium chloride 0.9 % 100 mL IVPB (0 mg Intravenous Stopped 10/08/16 0041)  sodium chloride 0.9 % 1,000 mL with thiamine 892 mg, folic acid 1 mg, multivitamins adult 10 mL infusion ( Intravenous New Bag/Given 10/08/16 0045)     Initial Impression / Assessment and Plan / ED  Course  I have reviewed the triage vital signs and the nursing notes.  Pertinent labs & imaging results that were available during my care of the patient were reviewed by me and considered in my medical decision making (see chart for details).     Patient presents with reported seizure activity. No collateral information available and patient is unable to provide history regarding events. He is currently oriented 2. Otherwise neurologically intact. Does report alcohol use. Alcohol level 52. Reports recent change in seizure medication.  Workup is largely reassuring. He was given a banana bag. On recheck, he is tolerating fluids and ambulatory at his baseline. Recommend follow-up with his primary physician or neurologist for adjustments in seizure medications. He was loaded with 1 g of IV Keppra.  After history, exam, and medical workup I feel the patient has been appropriately medically screened and is safe for discharge home. Pertinent diagnoses were discussed with the patient. Patient was given return precautions.  Final Clinical Impressions(s) / ED Diagnoses   Final diagnoses:  Alcoholic intoxication without complication ([place])  Seizure disorder ([place])    New Prescriptions New Prescriptions   No medications on file   I personally performed the services described in this documentation, which was scribed in my presence. The recorded information has been reviewed and is accurate.    Merryl Hacker, MD 10/08/16 [phone]

## 2016-10-07 NOTE — ED Triage Notes (Signed)
Per EMS pt with seizure history had 3 witnessed grand mal seizures lasting 30-45 seconds each.  Pt was switched from Dilantin to Keppra 2 days ago and has been taking as directed.  Pt was A&O x 3 on arrival.  Pt oriented to person, not to place or time at this time. Pt drinks four 40 oz beers per day.  Today has had three 40 oz beers.  Vision problems that are being followed by [facility].  CBG 84, BP 126/71.

## 2016-10-07 NOTE — ED Notes (Signed)
Pt states he was changed last week from Dilantin to [place], pt states this change was made on his request. Pt alert but disoriented.  Side rails padded for safety. Pt states his MD is suppose to be ordering a CT scan, pt is unaware if this has been ordered.

## 2016-10-08 LAB — BASIC METABOLIC PANEL
Anion gap: 11 (ref 5–15)
BUN: 8 mg/dL (ref 6–20)
CO2: 21 mmol/L — ABNORMAL LOW (ref 22–32)
Calcium: 8.5 mg/dL — ABNORMAL LOW (ref 8.9–10.3)
Chloride: 110 mmol/L (ref 101–111)
Creatinine, Ser: 1.12 mg/dL (ref 0.61–1.24)
GFR calc Af Amer: >60 mL/min (ref >60)
GLUCOSE: 97 mg/dL (ref 65–99)
POTASSIUM: 3.8 mmol/L (ref 3.5–5.1)
Sodium: 142 mmol/L (ref 135–145)

## 2016-10-08 LAB — CBC WITH DIFFERENTIAL/PLATELET
Basophils Absolute: 0 10*3/uL (ref 0.0–0.1)
Basophils Relative: 1 %
EOS PCT: 4 %
Eosinophils Absolute: 0.2 10*3/uL (ref 0.0–0.7)
HCT: 38.7 % — ABNORMAL LOW (ref 39.0–52.0)
Hemoglobin: 12.6 g/dL — ABNORMAL LOW (ref 13.0–17.0)
LYMPHS ABS: 2.5 10*3/uL (ref 0.7–4.0)
LYMPHS PCT: 42 %
MCH: 28.2 pg (ref 26.0–34.0)
MCHC: 32.6 g/dL (ref 30.0–36.0)
MCV: 86.6 fL (ref 78.0–100.0)
MONOS PCT: 6 %
Monocytes Absolute: 0.3 10*3/uL (ref 0.1–1.0)
Neutro Abs: 2.8 10*3/uL (ref 1.7–7.7)
Neutrophils Relative %: 47 %
PLATELETS: 230 10*3/uL (ref 150–400)
RBC: 4.47 MIL/uL (ref 4.22–5.81)
RDW: 14 % (ref 11.5–15.5)
WBC: 5.9 10*3/uL (ref 4.0–10.5)

## 2016-10-08 LAB — ETHANOL: Alcohol, Ethyl (B): 52 mg/dL — ABNORMAL HIGH (ref <5)

## 2016-10-08 NOTE — ED Notes (Signed)
Patients "friend" and caregiver "[name] [phone] called to pick up patient.  [name] proceeded to yell at this RN calling me a "dirty bitch" and referring to our care as "shitty" and insisting we keep patient until tomorrow. Again attempts made to explain to caregiver patient is appropriate for d/c and we have had no further seizure activity in this department. Pt returned call several times cursing and yelling at several staff members

## 2016-10-08 NOTE — ED Notes (Signed)
Pt urged to stand by Dr. Dina Rich and this RN. Pt cursing at staff "leave me the fuck alone", pt then throwing items on the floor when assistance with dressing was attempted.

## 2016-10-08 NOTE — ED Notes (Signed)
Attempts made to stand patient with EMT, pt required 2 person assist to stand, pt unable to take a step in room. Pt placed back in bed with side rails up for safety.

## 2016-10-08 NOTE — ED Notes (Signed)
This RN spoke with patients friend/ caregiver [name] for more than 30 mins regarding needing to find someone to provide this patient a ride home after discharge; Lewis states that he is "all hes got" but does not own a car in order to retreive the patient; Bobby Rumpf was seen in ER visiting this patient just prior to the patient going up for discharge, Lewis states he walked home; Bobby Rumpf stated that the only time he could come get this patient would be noon tomorrow (10/08/16); Lewis also made the comment that this hospital is being "inhumane" to discharge a patient a few hours after him having 3 seizures and that we should keep him to monitor him longer; RN explained to Lewis that the patient has had a thorough evaluation by an EDP and has been deemed well enough to be discharged home; after having a lengthy conversation with Lewis to encourage him to come get this patient, I was not successful

## 2016-10-08 NOTE — ED Notes (Signed)
[name] informed that cab to take patient home but he needs to greet cab at sidewalk since patient had visual deficits; Lewis agreed

## 2016-10-08 NOTE — ED Notes (Addendum)
Spoke with "care giver" who was refusing to come to get patient from the ED due to patient having multiple seizures and should be admitted for observation.  This RN tried to explain to care giver that the patient has been discharged and that he can follow up with neurologist on outpatient basis, care giver became verbally aggressive over the phone with this RN.  Phone call ended by this RN due to the cursing and yelling over the phone.

## 2016-10-08 NOTE — ED Notes (Signed)
Pt provided sandwich and beverage, pt able to sit up and feed himself without difficulty

## 2016-10-08 NOTE — ED Notes (Signed)
Pt resting on stretcher with eyes closed, RR even and unlabored, no further seizure activity noted. Will attempt to ambulate patient for d/c

## 2016-10-08 NOTE — ED Notes (Signed)
Pt's "friend" at bedside. Per friend he read a lawsuit online regarding "cerebelar atrophy" in extended use of Dilantin, friend states he has consulted an attorney and suggested to patient he might want to change medication. Pt was then changed to [place], friend states pt is now decreasing alcohol usage.  Friend states he is unable to take patient home tonight, he is requesting we keep patient for observation until tomorrow. Friend state "I believe for a healthcare facility to send someone home after 3 seizures is, well jaded" attempts made to explain to friend we will wait for all results to come back before that determination can be made.

## 2016-10-08 NOTE — Discharge Instructions (Signed)
You were seen today for reported seizure activity. It is important that you take your seizure medications as prescribed and follow up with your doctor.

## 2016-10-08 NOTE — ED Notes (Signed)
Pt's friend will be leaving, number and meds at bedside

## 2016-10-08 NOTE — ED Notes (Signed)
Multiple visitors at bedside, no further seizure activity

## 2017-01-31 ENCOUNTER — Emergency Department (HOSPITAL_COMMUNITY)
Admission: EM | Admit: 2017-01-31 | Discharge: 2017-02-01 | Disposition: A | Discharge status: Home / Self Care | Payer: Medicaid Other | Attending: Physician Assistant | Admitting: Physician Assistant

## 2017-01-31 ENCOUNTER — Emergency Department (HOSPITAL_COMMUNITY): Payer: Medicaid Other

## 2017-01-31 ENCOUNTER — Encounter (HOSPITAL_COMMUNITY): Payer: Self-pay | Admitting: Emergency Medicine

## 2017-01-31 DIAGNOSIS — Z91199 Patient's noncompliance with other medical treatment and regimen due to unspecified reason: Secondary | ICD-10-CM

## 2017-01-31 DIAGNOSIS — F17210 Nicotine dependence, cigarettes, uncomplicated: Secondary | ICD-10-CM | POA: Insufficient documentation

## 2017-01-31 DIAGNOSIS — R55 Syncope and collapse: Secondary | ICD-10-CM | POA: Diagnosis not present

## 2017-01-31 DIAGNOSIS — Z9119 Patient's noncompliance with other medical treatment and regimen: Secondary | ICD-10-CM | POA: Insufficient documentation

## 2017-01-31 DIAGNOSIS — R569 Unspecified convulsions: Secondary | ICD-10-CM | POA: Insufficient documentation

## 2017-01-31 DIAGNOSIS — Z79899 Other long term (current) drug therapy: Secondary | ICD-10-CM | POA: Diagnosis not present

## 2017-01-31 DIAGNOSIS — G40909 Epilepsy, unspecified, not intractable, without status epilepticus: Secondary | ICD-10-CM

## 2017-01-31 LAB — CK: Total CK: 1119 U/L — ABNORMAL HIGH (ref 49–397)

## 2017-01-31 LAB — CBG MONITORING, ED: Glucose-Capillary: 94 mg/dL (ref 65–99)

## 2017-01-31 LAB — BASIC METABOLIC PANEL
ANION GAP: 8 (ref 5–15)
BUN: 8 mg/dL (ref 6–20)
CHLORIDE: 107 mmol/L (ref 101–111)
CO2: 24 mmol/L (ref 22–32)
CREATININE: 1.11 mg/dL (ref 0.61–1.24)
Calcium: 8.8 mg/dL — ABNORMAL LOW (ref 8.9–10.3)
GFR calc non Af Amer: >60 mL/min (ref >60)
Glucose, Bld: 93 mg/dL (ref 65–99)
POTASSIUM: 3.6 mmol/L (ref 3.5–5.1)
SODIUM: 139 mmol/L (ref 135–145)

## 2017-01-31 LAB — CBC
HCT: 36 % — ABNORMAL LOW (ref 39.0–52.0)
Hemoglobin: 11.9 g/dL — ABNORMAL LOW (ref 13.0–17.0)
MCH: 27.9 pg (ref 26.0–34.0)
MCHC: 33.1 g/dL (ref 30.0–36.0)
MCV: 84.3 fL (ref 78.0–100.0)
PLATELETS: 250 10*3/uL (ref 150–400)
RBC: 4.27 MIL/uL (ref 4.22–5.81)
RDW: 14.7 % (ref 11.5–15.5)
WBC: 9.5 10*3/uL (ref 4.0–10.5)

## 2017-01-31 LAB — I-STAT TROPONIN, ED: Troponin i, poc: 0.05 ng/mL (ref 0.00–0.08)

## 2017-01-31 LAB — ETHANOL

## 2017-01-31 MED ORDER — SODIUM CHLORIDE 0.9 % IV SOLN
1000.0000 mg | Freq: Once | INTRAVENOUS | Status: AC
  Administered 2017-01-31: 1000 mg via INTRAVENOUS
  Filled 2017-01-31: qty 10

## 2017-01-31 MED ORDER — SODIUM CHLORIDE 0.9 % IV BOLUS (SEPSIS)
1000.0000 mL | Freq: Once | INTRAVENOUS | Status: AC
  Administered 2017-01-31: 1000 mL via INTRAVENOUS

## 2017-01-31 MED ORDER — THIAMINE HCL 100 MG/ML IJ SOLN
100.0000 mg | Freq: Once | INTRAMUSCULAR | Status: AC
  Administered 2017-01-31: 100 mg via INTRAVENOUS
  Filled 2017-01-31: qty 2

## 2017-01-31 NOTE — ED Notes (Signed)
Pt attempted but unable to urinate at this time. Aware we need sample for testing and will continue trying.

## 2017-01-31 NOTE — ED Notes (Signed)
Patient's friend and live-in aide called and spoke with patient. He reports that patient had a CT/MRI brain about 4 months ago and they found a "benign blood clot."

## 2017-01-31 NOTE — ED Triage Notes (Addendum)
Per [facility]: Pt to ED following unwitnessed syncopal episode. Pt was outside helping his friend/live-in home aid pulling some weeds in the yard and pt was found lying in the grass. Per EMS, pt initially confused upon their arrival - LOC has improved, pt A&O x 4 at this time. Pt hx seizures but has not taken his Keppra in over a month d/t financial reasons. C-collar placed d/t possible injury - pt denies neck pain but c/o mild headache. 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 18g. LFA. EMS VS: 147/100, HR 97 NSR, R 16, 97% RA, CBG 124. Pt denies fevers/illness.

## 2017-01-31 NOTE — ED Provider Notes (Signed)
[facility] DEPT Provider Note   CSN: [id number] Arrival date & time: 01/31/17  2021     History   Chief Complaint Chief Complaint  Patient presents with  . Loss of Consciousness    HPI Brent Ramos is a 61 y.o. male with history of seizures who presents to the ED for evaluation after unwitnessed syncopal episode. Patient is a poor historian and requires redirection frequently. He is alert and oriented 4. States he was helping out his live in aid do some yard work when he reportedly passed out. Cannot remember preceding events or prodromal symptoms.  He is alone, does not know if he had a seizure as no one witnessed the fall. Currently he reports left forehead and neck pain, denies chest pain, shortness of breath, abdominal pain, numbness or weakness to extremities, back pain. States he has been at baseline state of health prior to syncopal event, no diarrhea, vomiting, fevers, cough, melena, BRBPR. States he has not taken his seizure medication for at least 2 months due to finances, cannot remember last time he had a seizure. Admits to occasional binge drinking ETOH, last time one month ago.  HPI  Past Medical History:  Diagnosis Date  . Seizures ([place])     There are no active problems to display for this patient.   Past Surgical History:  Procedure Laterality Date  . NEPHRECTOMY LIVING DONOR         Home Medications    Prior to Admission medications   Medication Sig Start Date End Date Taking? Authorizing Provider  brimonidine (ALPHAGAN) 0.2 % ophthalmic solution Place 1 drop into both eyes 3 (three) times daily.   Yes [provider]  dorzolamide (TRUSOPT) 2 % ophthalmic solution Place 1 drop into both eyes 3 (three) times daily.   Yes [provider]  timolol (TIMOPTIC) 0.5 % ophthalmic solution Place 1 drop into both eyes 2 (two) times daily.   Yes [provider]  HYDROcodone-acetaminophen (NORCO/VICODIN) 5-325 MG tablet Take 1 tablet  by mouth every 6 (six) hours as needed for moderate pain. Patient not taking: Reported on 02/01/2017 02/18/16   Pisciotta, Elmyra Ricks, PA-C  ibuprofen (ADVIL,MOTRIN) 800 MG tablet Take 1 tablet (800 mg total) by mouth 3 (three) times daily. Patient not taking: Reported on 02/01/2017 02/18/16   Pisciotta, Elmyra Ricks, PA-C  levETIRAcetam (KEPPRA) 500 MG tablet Take 1 tablet (500 mg total) by mouth 2 (two) times daily. 02/01/17   Kinnie Feil, PA-C  oxyCODONE-acetaminophen (PERCOCET/ROXICET) 5-325 MG tablet Take 1-2 tablets by mouth every 4 (four) hours as needed for severe pain. Patient not taking: Reported on 02/01/2017 05/08/15   Lorayne Bender, PA-C  phenytoin (DILANTIN) 100 MG ER capsule Take 1 capsule (100 mg total) by mouth 3 (three) times daily. Patient not taking: Reported on 02/01/2017 01/26/14   Starlyn Skeans, PA-C    Family History No family history on file.  Social History Social History  Substance Use Topics  . Smoking status: Current Every Day Smoker    Packs/day: 1.00    Types: Cigarettes  . Smokeless tobacco: Never Used  . Alcohol use Yes     Comment: rarely      Allergies   Patient has no known allergies.   Review of Systems Review of Systems  Constitutional: Negative for fever.  Eyes: Positive for visual disturbance (chronic).  Respiratory: Negative for shortness of breath.   Cardiovascular: Negative for chest pain.  Gastrointestinal: Negative for blood in stool, diarrhea and vomiting.  Genitourinary: Negative for difficulty urinating and dysuria.  Musculoskeletal: Positive for neck pain. Negative for back pain.  Skin: Negative for wound.  Neurological: Positive for syncope. Negative for seizures, weakness, light-headedness, numbness and headaches.     Physical Exam Updated Vital Signs BP (!) 151/101   Pulse 80   Temp 98.7 F (37.1 C) (Oral)   Resp (!) 21   Ht 6' (1.829 m)   Wt 99.8 kg (220 lb)   SpO2 100%   BMI 29.84 kg/m   Physical Exam    Constitutional: He is oriented to person, place, and time. He appears well-developed and well-nourished. No distress.  HENT:  Head: Normocephalic and atraumatic.  Nose: Nose normal.  Mouth/Throat: No oropharyngeal exudate.  Moist mucous membranes normal  No intraoral or tongue injury or bleeding +Left forehead tenderness No facial or nasal bone tenderness  Eyes: Conjunctivae are normal.  Neck: Normal range of motion. Neck supple.  Pt in cervical collar +Midline cervical tenderness  Cardiovascular: Normal rate, regular rhythm, normal heart sounds and intact distal pulses.   No murmur heard. Radial and DP pulses 2+ bilaterally No LE edema  Pulmonary/Chest: Effort normal and breath sounds normal. No respiratory distress. He has no wheezes. He has no rales.  Abdominal: Soft. Bowel sounds are normal. He exhibits no distension. There is no tenderness.  Musculoskeletal: Normal range of motion. He exhibits no deformity.  No TL spine midline tenderness Full PROM of bilateral shoulders and hips without pain Pelvis stable LE and UE compartments soft and non tender Ambulates without difficulty   Lymphadenopathy:    He has no cervical adenopathy.  Neurological: He is alert and oriented to person, place, and time.  A&O to self, place and time. Speech and phonation normal.  Thought process coherent.   Strength 5/5 in upper and lower extremities.   Sensation to light touch intact in upper and lower extremities.  Gait norma.    CN I and II not tested CN III, IV, VI PEERL and EOMs intact bilaterally CN V light touch intact in all 3 divisions of trigeminal nerve CN VII facial nerve movements intact, symmetric, bilaterally CN VIII hearing intact to finger rub, bilaterally CN IX, X no uvula deviation, symmetric soft palate rise CN XI 5/5 SCM and trapezius strength bilaterally  CN XII Tongue midline with symmetric L/R movement  Skin: Skin is warm and dry. Capillary refill takes less than 2  seconds.  Psychiatric: He has a normal mood and affect. His behavior is normal. Judgment and thought content normal.  Nursing note and vitals reviewed.    ED Treatments / Results  Labs (all labs ordered are listed, but only abnormal results are displayed) Labs Reviewed  BASIC METABOLIC PANEL - Abnormal; Notable for the following:       Result Value   Calcium 8.8 (*)    All other components within normal limits  CBC - Abnormal; Notable for the following:    Hemoglobin 11.9 (*)    HCT 36.0 (*)    All other components within normal limits  URINALYSIS, ROUTINE W REFLEX MICROSCOPIC - Abnormal; Notable for the following:    Ketones, ur 5 (*)    Protein, ur 30 (*)    All other components within normal limits  CK - Abnormal; Notable for the following:    Total CK 1,119 (*)    All other components within normal limits  ETHANOL  CBG MONITORING, ED  I-STAT TROPONIN, ED    EKG  EKG Interpretation  None       Radiology Ct Head Wo Contrast  Result Date: 01/31/2017 CLINICAL DATA:  Head trauma, fall EXAM: CT HEAD WITHOUT CONTRAST CT CERVICAL SPINE WITHOUT CONTRAST TECHNIQUE: Multidetector CT imaging of the head and cervical spine was performed following the standard protocol without intravenous contrast. Multiplanar CT image reconstructions of the cervical spine were also generated. COMPARISON:  01/30/2014 FINDINGS: CT HEAD FINDINGS Brain: Mild age related brain atrophy. No acute intracranial hemorrhage, mass lesion, infarction, midline shift, or herniation, hydrocephalus, or extra-axial fluid collection. No focal mass effect or edema. Cisterns are patent. No cerebellar abnormality. Vascular: No hyperdense vessel or unexpected calcification. Skull: Normal. Negative for fracture or focal lesion. Sinuses/Orbits: No acute finding. Other: None. CT CERVICAL SPINE FINDINGS Alignment: Straightened cervical alignment with slight kyphosis may be positional or spasm related. Skull base and vertebrae: No  acute fracture. No primary bone lesion or focal pathologic process. Soft tissues and spinal canal: No prevertebral fluid or swelling. No visible canal hematoma. Disc levels: Mid and lower cervical degenerative spondylosis spanning C5-T1. These levels demonstrate disc space narrowing, sclerosis and anterior osteophytes. Mild multilevel facet arthropathy posteriorly. Facets remain aligned. Upper chest: Negative. Other: None. IMPRESSION: Mild brain atrophy without acute intracranial abnormality by noncontrast CT. Cervical degenerative spondylosis without acute osseous finding, fracture, or malalignment. Electronically Signed   By: Jerilynn Mages.  Shick M.D.   On: 01/31/2017 22:18   Ct Cervical Spine Wo Contrast  Result Date: 01/31/2017 CLINICAL DATA:  Head trauma, fall EXAM: CT HEAD WITHOUT CONTRAST CT CERVICAL SPINE WITHOUT CONTRAST TECHNIQUE: Multidetector CT imaging of the head and cervical spine was performed following the standard protocol without intravenous contrast. Multiplanar CT image reconstructions of the cervical spine were also generated. COMPARISON:  01/30/2014 FINDINGS: CT HEAD FINDINGS Brain: Mild age related brain atrophy. No acute intracranial hemorrhage, mass lesion, infarction, midline shift, or herniation, hydrocephalus, or extra-axial fluid collection. No focal mass effect or edema. Cisterns are patent. No cerebellar abnormality. Vascular: No hyperdense vessel or unexpected calcification. Skull: Normal. Negative for fracture or focal lesion. Sinuses/Orbits: No acute finding. Other: None. CT CERVICAL SPINE FINDINGS Alignment: Straightened cervical alignment with slight kyphosis may be positional or spasm related. Skull base and vertebrae: No acute fracture. No primary bone lesion or focal pathologic process. Soft tissues and spinal canal: No prevertebral fluid or swelling. No visible canal hematoma. Disc levels: Mid and lower cervical degenerative spondylosis spanning C5-T1. These levels demonstrate disc  space narrowing, sclerosis and anterior osteophytes. Mild multilevel facet arthropathy posteriorly. Facets remain aligned. Upper chest: Negative. Other: None. IMPRESSION: Mild brain atrophy without acute intracranial abnormality by noncontrast CT. Cervical degenerative spondylosis without acute osseous finding, fracture, or malalignment. Electronically Signed   By: Jerilynn Mages.  Shick M.D.   On: 01/31/2017 22:18    Procedures Procedures (including critical care time)  Medications Ordered in ED Medications  sodium chloride 0.9 % bolus 1,000 mL (0 mLs Intravenous Stopped 01/31/17 2300)  levETIRAcetam (KEPPRA) 1,000 mg in sodium chloride 0.9 % 100 mL IVPB (0 mg Intravenous Stopped 01/31/17 2300)  thiamine (B-1) injection 100 mg (100 mg Intravenous Given 01/31/17 2113)  sodium chloride 0.9 % bolus 1,000 mL (1,000 mLs Intravenous New Bag/Given 01/31/17 2323)     Initial Impression / Assessment and Plan / ED Course  I have reviewed the triage vital signs and the nursing notes.  Pertinent labs & imaging results that were available during my care of the patient were reviewed by me and considered in my medical decision making (see  chart for details).  Clinical Course as of Feb 01 45  Wed Jan 31, 2017  2236 CK Total: (!) 1,119 [CG]  2236 Hemoglobin: (!) 11.9 [CG]  2236 HCT: (!) 36.0 [CG]    Clinical Course User Index [CG] Kinnie Feil, PA-C   61 year old male with history of seizures presents to ED for unwitnessed syncopal episode. He is a poor historian and frequently requires redirection. He is alert and oriented 4. Cannot remember prodromal symptoms. Currently endorses left forehead and neck pain, otherwise asymptomatic. Admits to binge drinking alcohol occasionally, last time one month ago. Has not taken Keppra and several months due to finances.  On exam, vital signs are within normal limits. He is blind, otherwise no neuro deficits. He has midline cervical spine tenderness. We'll get blood  work, CT head and neck, start IV fluid bolus, And thiamine and reassess.  ED lab work and imaging including CBC, BMP, troponin, EtOH, CBG, CT head/neck and EKG are reassuring. CK borderline at 1100. Patient is not having symptoms of rhabdomyolysis including muscle pain, weakness, cramping. We'll give fluids and wait for urine.   Final Clinical Impressions(s) / ED Diagnoses   0045: Urine normal. Will d/c after 2 L IVF. keppra rx refilled. Discussed importance of med compliance for seizures. He is to f/u with neurology ASAP. ED return precautions given.   Patient, ED treatment and discharge plan was discussed with supervising physician who also evaluated the patient and is agreeable with plan.  Final diagnoses:  Syncope and collapse  Non compliance with medical treatment  Seizure disorder (HCC)    New Prescriptions New Prescriptions   LEVETIRACETAM (KEPPRA) 500 MG TABLET    Take 1 tablet (500 mg total) by mouth 2 (two) times daily.     Kinnie Feil, PA-C 02/01/17 0046    Macarthur Critchley, MD 02/04/17 1006

## 2017-02-01 LAB — URINALYSIS, ROUTINE W REFLEX MICROSCOPIC
BACTERIA UA: NONE SEEN
BILIRUBIN URINE: NEGATIVE
Glucose, UA: NEGATIVE mg/dL
HGB URINE DIPSTICK: NEGATIVE
Ketones, ur: 5 mg/dL — AB
LEUKOCYTES UA: NEGATIVE
NITRITE: NEGATIVE
PROTEIN: 30 mg/dL — AB
Specific Gravity, Urine: 1.018 (ref 1.005–1.030)
Squamous Epithelial / LPF: NONE SEEN
pH: 6 (ref 5.0–8.0)

## 2017-02-01 MED ORDER — LEVETIRACETAM 500 MG PO TABS: 500.0000 mg | ORAL_TABLET | Freq: Two times a day (BID) | ORAL | 0 refills | Status: DC

## 2017-02-01 NOTE — ED Notes (Signed)
ED Provider at bedside. 

## 2017-02-01 NOTE — ED Notes (Signed)
Obtained taxi voucher for pt and escorted to waiting area. Pt departed in NAD. Called pt's partner, [name], to inform of pt's departure.

## 2017-02-01 NOTE — Discharge Instructions (Signed)
You presented to ED for evaluation after passing out episode.  Your blood work, CT scans, EKGs and urine were mostly normal.  I suspect you may have had a seizure since you haven't been taking your medications.   It is important you take your seizure medication as prescribed by your neurologist. Your prescription for keppra has been refilled. Follow up with neurologist as soon as possible for refills.   Lab work shows you are slightly dehydrated, drink 2-3 L of water a day.

## 2017-02-03 ENCOUNTER — Other Ambulatory Visit: Payer: Self-pay

## 2017-02-03 ENCOUNTER — Emergency Department (HOSPITAL_COMMUNITY)
Admission: EM | Admit: 2017-02-03 | Discharge: 2017-02-03 | Disposition: A | Discharge status: Home / Self Care | Payer: Medicaid Other | Attending: Emergency Medicine | Admitting: Emergency Medicine

## 2017-02-03 ENCOUNTER — Encounter (HOSPITAL_COMMUNITY): Payer: Self-pay

## 2017-02-03 DIAGNOSIS — F17210 Nicotine dependence, cigarettes, uncomplicated: Secondary | ICD-10-CM | POA: Insufficient documentation

## 2017-02-03 DIAGNOSIS — Z79899 Other long term (current) drug therapy: Secondary | ICD-10-CM | POA: Insufficient documentation

## 2017-02-03 DIAGNOSIS — R569 Unspecified convulsions: Secondary | ICD-10-CM | POA: Insufficient documentation

## 2017-02-03 LAB — COMPREHENSIVE METABOLIC PANEL
ALBUMIN: 3.8 g/dL (ref 3.5–5.0)
ALT: 29 U/L (ref 17–63)
AST: 134 U/L — ABNORMAL HIGH (ref 15–41)
Alkaline Phosphatase: 57 U/L (ref 38–126)
Anion gap: 9 (ref 5–15)
BUN: 8 mg/dL (ref 6–20)
CO2: 22 mmol/L (ref 22–32)
Calcium: 8.5 mg/dL — ABNORMAL LOW (ref 8.9–10.3)
Chloride: 110 mmol/L (ref 101–111)
Creatinine, Ser: 1.03 mg/dL (ref 0.61–1.24)
GFR calc Af Amer: >60 mL/min (ref >60)
GFR calc non Af Amer: >60 mL/min (ref >60)
GLUCOSE: 86 mg/dL (ref 65–99)
POTASSIUM: 3.7 mmol/L (ref 3.5–5.1)
SODIUM: 141 mmol/L (ref 135–145)
Total Bilirubin: 0.5 mg/dL (ref 0.3–1.2)
Total Protein: 5.9 g/dL — ABNORMAL LOW (ref 6.5–8.1)

## 2017-02-03 LAB — CBC
HEMATOCRIT: 36.4 % — AB (ref 39.0–52.0)
HEMOGLOBIN: 11.8 g/dL — AB (ref 13.0–17.0)
MCH: 27.7 pg (ref 26.0–34.0)
MCHC: 32.4 g/dL (ref 30.0–36.0)
MCV: 85.4 fL (ref 78.0–100.0)
Platelets: 217 10*3/uL (ref 150–400)
RBC: 4.26 MIL/uL (ref 4.22–5.81)
RDW: 14.9 % (ref 11.5–15.5)
WBC: 6.8 10*3/uL (ref 4.0–10.5)

## 2017-02-03 LAB — CBG MONITORING, ED: Glucose-Capillary: 77 mg/dL (ref 65–99)

## 2017-02-03 MED ORDER — SODIUM CHLORIDE 0.9 % IV SOLN
1000.0000 mg | Freq: Once | INTRAVENOUS | Status: AC
  Administered 2017-02-03: 1000 mg via INTRAVENOUS
  Filled 2017-02-03: qty 10

## 2017-02-03 NOTE — Discharge Instructions (Signed)
Please read and follow all provided instructions.  Your diagnoses today include:  1. Seizure ([place])     Tests performed today include:  Blood counts and electrolytes, kidney function  Vital signs. See below for your results today.   Medications prescribed:   None  Take any prescribed medications only as directed.  Home care instructions:  Follow any educational materials contained in this packet.  Follow-up instructions: Please follow-up with your primary care provider in the next 3 days for further evaluation of your symptoms.   Return instructions:   Please return to the Emergency Department if you experience worsening symptoms.   Return with additional seizures.  Please return if you have any other emergent concerns.  Additional Information:  Your vital signs today were: BP (!) 144/95    Pulse 79    Resp 17    SpO2 99%  If your blood pressure (BP) was elevated above 135/85 this visit, please have this repeated by your doctor within one month. --------------

## 2017-02-03 NOTE — Care Management Note (Signed)
Case Management Note  Patient Details  Name: Brent Ramos MRN: [id number] Date of Birth: Jun 28, 1955  Subjective/Objective:    seizures                Action/Plan: Discharge Planning: NCM received a referral for medication assistance. Pt has Medicaid to pay for his medications. He has Rx waiting a [facility] in [place]. If he cannot afford copay he can request a one time waiver of copay cost at pharmacy. NCM spoke to [place] PA to make aware. No PCP noted. Pt can utilize the [facility] or [facility] for follow up appt.    Expected Discharge Date:                 Expected Discharge Plan:  Home/Self Care  In-House Referral:  NA  Discharge planning Services  CM Consult, Medication Assistance  Post Acute Care Choice:  NA Choice offered to:  NA  DME Arranged:  N/A DME Agency:  NA  HH Arranged:  NA HH Agency:  NA  Status of Service:  Completed, signed off  If discussed at [place] of Stay Meetings, dates discussed:    Additional Comments:  Erenest Rasher, RN 02/03/2017, 9:53 AM

## 2017-02-03 NOTE — ED Triage Notes (Signed)
Pt. Presents by EMS with seizure activity. Pt. Actively seized for 1 minute with EMS and 4 times prior according to pt. Friend. EMS gave 5 mg midazolam. According to pt. Friend, he should be taking Andres Labrum but is not. Pt. Seen 2 days ago for seizures.   CBG 71 and was given D10.

## 2017-02-03 NOTE — ED Provider Notes (Signed)
[facility] DEPT Provider Note   CSN: [id number] Arrival date & time: 02/03/17  0530     History   Chief Complaint Chief Complaint  Patient presents with  . Seizures    HPI Brent Ramos is a 61 y.o. male.  Patient with history of seizure disorder, on Keppra, alcoholism presents with recurrent seizures. Patient seen in emergency department 3 days ago for a seizure. He he admits to not taking his Keppra due to financial problems. Patient was given IV Keppra here and was discharged. Prior to that seizure, patient did have approximately 16 ounces of beer. Patient has not had alcohol for 3 days. This morning patient was sitting on a couch when he began having a seizure. Patient had 5 spells of muscle tightness, jaw clenching without return to baseline in between. This lasted for 20-30 minutes. EMS was called. They gave midazolam. Patient slipped from the couch to the floor and was assisted back up by a friend. No indication of head or neck injury today. No other medical complaints reported. The onset of this condition was acute. The course is resolved, patient back to baseline. Aggravating factors: none. Alleviating factors: none.        Past Medical History:  Diagnosis Date  . Seizures ([place])     There are no active problems to display for this patient.   Past Surgical History:  Procedure Laterality Date  . NEPHRECTOMY LIVING DONOR         Home Medications    Prior to Admission medications   Medication Sig Start Date End Date Taking? Authorizing Provider  brimonidine (ALPHAGAN) 0.2 % ophthalmic solution Place 1 drop into both eyes 3 (three) times daily.    [provider]  dorzolamide (TRUSOPT) 2 % ophthalmic solution Place 1 drop into both eyes 3 (three) times daily.    [provider]  HYDROcodone-acetaminophen (NORCO/VICODIN) 5-325 MG tablet Take 1 tablet by mouth every 6 (six) hours as needed for moderate pain. Patient not taking: Reported on  02/01/2017 02/18/16   Pisciotta, Elmyra Ricks, PA-C  ibuprofen (ADVIL,MOTRIN) 800 MG tablet Take 1 tablet (800 mg total) by mouth 3 (three) times daily. Patient not taking: Reported on 02/01/2017 02/18/16   Pisciotta, Elmyra Ricks, PA-C  levETIRAcetam (KEPPRA) 500 MG tablet Take 1 tablet (500 mg total) by mouth 2 (two) times daily. 02/01/17   Kinnie Feil, PA-C  oxyCODONE-acetaminophen (PERCOCET/ROXICET) 5-325 MG tablet Take 1-2 tablets by mouth every 4 (four) hours as needed for severe pain. Patient not taking: Reported on 02/01/2017 05/08/15   Lorayne Bender, PA-C  phenytoin (DILANTIN) 100 MG ER capsule Take 1 capsule (100 mg total) by mouth 3 (three) times daily. Patient not taking: Reported on 02/01/2017 01/26/14   Forcucci, Loma Sousa, PA-C  timolol (TIMOPTIC) 0.5 % ophthalmic solution Place 1 drop into both eyes 2 (two) times daily.    [provider]    Family History History reviewed. No pertinent family history.  Social History Social History  Substance Use Topics  . Smoking status: Current Every Day Smoker    Packs/day: 1.00    Types: Cigarettes  . Smokeless tobacco: Never Used  . Alcohol use Yes     Comment: rarely      Allergies   Patient has no known allergies.   Review of Systems Review of Systems  Constitutional: Negative for fatigue and fever.  HENT: Negative for rhinorrhea, sore throat and tinnitus.   Eyes: Negative for photophobia, pain, redness and visual disturbance.  Respiratory:  Negative for cough and shortness of breath.   Cardiovascular: Negative for chest pain.  Gastrointestinal: Negative for abdominal pain, diarrhea, nausea and vomiting.  Genitourinary: Negative for dysuria.  Musculoskeletal: Negative for back pain, gait problem, myalgias and neck pain.  Skin: Negative for rash and wound.  Neurological: Positive for seizures. Negative for dizziness, syncope, weakness, light-headedness, numbness and headaches.  Psychiatric/Behavioral: Negative for confusion  and decreased concentration.     Physical Exam Updated Vital Signs BP 128/89   Pulse 83   Resp (!) 23   SpO2 96%   Physical Exam  Constitutional: He is oriented to person, place, and time. He appears well-developed and well-nourished.  HENT:  Head: Normocephalic and atraumatic.  Right Ear: Tympanic membrane, external ear and ear canal normal.  Left Ear: Tympanic membrane, external ear and ear canal normal.  Nose: Nose normal.  Mouth/Throat: Uvula is midline, oropharynx is clear and moist and mucous membranes are normal.  Eyes: EOM and lids are normal.  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   Neck: Normal range of motion. Neck supple.  Cardiovascular: Normal rate and regular rhythm.   Pulmonary/Chest: Effort normal and breath sounds normal.  Abdominal: Soft. There is no tenderness.  Musculoskeletal: Normal range of motion.       Cervical back: He exhibits normal range of motion, no tenderness and no bony tenderness.  Neurological: He is alert and oriented to person, place, and time. He has normal strength and normal reflexes. No cranial nerve deficit or sensory deficit. He exhibits normal muscle tone. He displays a negative Romberg sign. Coordination and gait normal. GCS eye subscore is 4. GCS verbal subscore is 5. GCS motor subscore is 6.  Skin: Skin is warm and dry.  Psychiatric: He has a normal mood and affect.  Nursing note and vitals reviewed.    ED Treatments / Results  Labs (all labs ordered are listed, but only abnormal results are displayed) Labs Reviewed  COMPREHENSIVE METABOLIC PANEL - Abnormal; Notable for the following:       Result Value   Calcium 8.5 (*)    Total Protein 5.9 (*)    AST 134 (*)    All other components within normal limits  CBC - Abnormal; Notable for the following:    Hemoglobin 11.8 (*)    HCT 36.4 (*)    All other components within normal limits  CBG MONITORING, ED    EKG  EKG  Interpretation  Date/Time:  Saturday February 03 2017 05:30:10 EDT Ventricular Rate:  87 PR Interval:    QRS Duration: 91 QT Interval:  407 QTC Calculation: 490 R Axis:   64 Text Interpretation:  Sinus rhythm RSR' in V1 or V2, probably normal variant Borderline prolonged QT interval No significant change since last tracing Confirmed by Pryor Curia [phone]) on 02/03/2017 6:24:31 AM       Radiology No results found.  Procedures Procedures (including critical care time)  Medications Ordered in ED Medications  levETIRAcetam (KEPPRA) 1,000 mg in sodium chloride 0.9 % 100 mL IVPB (0 mg Intravenous Stopped 02/03/17 0825)     Initial Impression / Assessment and Plan / ED Course  I have reviewed the triage vital signs and the nursing notes.  Pertinent labs & imaging results that were available during my care of the patient were reviewed by me and considered in my medical decision making (see chart for details).     Patient seen and examined. Work-up initiated. Medications ordered. Will need  to ensure he can obtain medications prior to discharge.   Vital signs reviewed and are as follows: BP (!) 141/93   Pulse 82   Resp 17   SpO2 97%   Patient stable without any seizure activity during emergency department stay. I spoke with case manager, with spoke with the patient's pharmacy. They will wave the co-pay for the Keppra. Patient will be able to pick up his medications from his pharmacy today.  Patient updated and agrees with plan. Encourage return to the emergency department with any new symptoms, additional seizures.   Final Clinical Impressions(s) / ED Diagnoses   Final diagnoses:  Seizure [facility])   Patient with known history of seizures, Unable to afford medications for approximately one month. This is patient's second emergency department visit. Patient had 5 episodes of muscle tightness per family, however it was related to one seizure. Patient has completely returned to  baseline. No further activity in the ED. He was given another Reload. Labs reassuring and do not show other cause of symptoms. Patient has been drinking some alcohol, but not in excess per his account and family's account. Arrangements made for patient to be able to obtain medications. Do not feel that he needs admission for recurrent seizures at this time. No focal neurologic deficits on exam.  New Prescriptions Discharge Medication List as of 02/03/2017 10:01 AM       Carlisle Cater, PA-C 02/03/17 1110    Julianne Rice, MD 02/03/17 1529

## 2017-02-05 ENCOUNTER — Encounter (HOSPITAL_COMMUNITY): Payer: Self-pay | Admitting: Emergency Medicine

## 2017-02-05 ENCOUNTER — Inpatient Hospital Stay (HOSPITAL_COMMUNITY): Payer: Medicaid Other

## 2017-02-05 ENCOUNTER — Inpatient Hospital Stay (HOSPITAL_COMMUNITY)
Admission: EM | Admit: 2017-02-05 | Discharge: 2017-02-06 | DRG: 101 | Disposition: A | Discharge status: Home / Self Care | Payer: Medicaid Other | Attending: Internal Medicine | Admitting: Internal Medicine

## 2017-02-05 DIAGNOSIS — H409 Unspecified glaucoma: Secondary | ICD-10-CM | POA: Diagnosis present

## 2017-02-05 DIAGNOSIS — Z905 Acquired absence of kidney: Secondary | ICD-10-CM

## 2017-02-05 DIAGNOSIS — Z91128 Patient's intentional underdosing of medication regimen for other reason: Secondary | ICD-10-CM | POA: Diagnosis not present

## 2017-02-05 DIAGNOSIS — F17210 Nicotine dependence, cigarettes, uncomplicated: Secondary | ICD-10-CM | POA: Diagnosis present

## 2017-02-05 DIAGNOSIS — F1910 Other psychoactive substance abuse, uncomplicated: Secondary | ICD-10-CM | POA: Diagnosis present

## 2017-02-05 DIAGNOSIS — M6282 Rhabdomyolysis: Secondary | ICD-10-CM | POA: Diagnosis present

## 2017-02-05 DIAGNOSIS — F1010 Alcohol abuse, uncomplicated: Secondary | ICD-10-CM | POA: Diagnosis present

## 2017-02-05 DIAGNOSIS — G40409 Other generalized epilepsy and epileptic syndromes, not intractable, without status epilepticus: Principal | ICD-10-CM | POA: Diagnosis present

## 2017-02-05 DIAGNOSIS — Z9114 Patient's other noncompliance with medication regimen: Secondary | ICD-10-CM | POA: Diagnosis not present

## 2017-02-05 DIAGNOSIS — G40909 Epilepsy, unspecified, not intractable, without status epilepticus: Secondary | ICD-10-CM | POA: Diagnosis not present

## 2017-02-05 DIAGNOSIS — R9431 Abnormal electrocardiogram [ECG] [EKG]: Secondary | ICD-10-CM | POA: Diagnosis not present

## 2017-02-05 DIAGNOSIS — Z79899 Other long term (current) drug therapy: Secondary | ICD-10-CM

## 2017-02-05 DIAGNOSIS — R569 Unspecified convulsions: Secondary | ICD-10-CM

## 2017-02-05 DIAGNOSIS — T426X6A Underdosing of other antiepileptic and sedative-hypnotic drugs, initial encounter: Secondary | ICD-10-CM | POA: Diagnosis present

## 2017-02-05 DIAGNOSIS — E860 Dehydration: Secondary | ICD-10-CM | POA: Diagnosis present

## 2017-02-05 HISTORY — DX: Alcohol abuse, uncomplicated: F10.10

## 2017-02-05 LAB — I-STAT ARTERIAL BLOOD GAS, ED
BICARBONATE: 24.8 mmol/L (ref 20.0–28.0)
O2 Saturation: 98 %
PH ART: 7.419 (ref 7.350–7.450)
TCO2: 26 mmol/L (ref 22–32)
pCO2 arterial: 38.3 mmHg (ref 32.0–48.0)
pO2, Arterial: 96 mmHg (ref 83.0–108.0)

## 2017-02-05 LAB — CBC WITH DIFFERENTIAL/PLATELET
Basophils Absolute: 0 10*3/uL (ref 0.0–0.1)
Basophils Relative: 0 %
Eosinophils Absolute: 0.1 10*3/uL (ref 0.0–0.7)
Eosinophils Relative: 1 %
HEMATOCRIT: 37.6 % — AB (ref 39.0–52.0)
HEMOGLOBIN: 12.6 g/dL — AB (ref 13.0–17.0)
LYMPHS ABS: 2.1 10*3/uL (ref 0.7–4.0)
LYMPHS PCT: 26 %
MCH: 28.3 pg (ref 26.0–34.0)
MCHC: 33.5 g/dL (ref 30.0–36.0)
MCV: 84.3 fL (ref 78.0–100.0)
MONOS PCT: 7 %
Monocytes Absolute: 0.5 10*3/uL (ref 0.1–1.0)
NEUTROS ABS: 5.4 10*3/uL (ref 1.7–7.7)
NEUTROS PCT: 66 %
Platelets: 240 10*3/uL (ref 150–400)
RBC: 4.46 MIL/uL (ref 4.22–5.81)
RDW: 14.7 % (ref 11.5–15.5)
WBC: 8.1 10*3/uL (ref 4.0–10.5)

## 2017-02-05 LAB — CK: Total CK: 9462 U/L — ABNORMAL HIGH (ref 49–397)

## 2017-02-05 LAB — RAPID URINE DRUG SCREEN, HOSP PERFORMED
AMPHETAMINES: NOT DETECTED
BARBITURATES: NOT DETECTED
BENZODIAZEPINES: POSITIVE — AB
COCAINE: NOT DETECTED
Opiates: NOT DETECTED
TETRAHYDROCANNABINOL: NOT DETECTED

## 2017-02-05 LAB — BASIC METABOLIC PANEL
Anion gap: 9 (ref 5–15)
BUN: 5 mg/dL — AB (ref 6–20)
CHLORIDE: 105 mmol/L (ref 101–111)
CO2: 25 mmol/L (ref 22–32)
CREATININE: 1.01 mg/dL (ref 0.61–1.24)
Calcium: 8.9 mg/dL (ref 8.9–10.3)
GFR calc non Af Amer: >60 mL/min (ref >60)
Glucose, Bld: 102 mg/dL — ABNORMAL HIGH (ref 65–99)
Potassium: 3.8 mmol/L (ref 3.5–5.1)
Sodium: 139 mmol/L (ref 135–145)

## 2017-02-05 LAB — CBG MONITORING, ED: Glucose-Capillary: 110 mg/dL — ABNORMAL HIGH (ref 65–99)

## 2017-02-05 LAB — ETHANOL

## 2017-02-05 MED ORDER — SODIUM CHLORIDE 0.9 % IV BOLUS (SEPSIS)
1000.0000 mL | Freq: Once | INTRAVENOUS | Status: AC
  Administered 2017-02-05: 1000 mL via INTRAVENOUS

## 2017-02-05 MED ORDER — ONDANSETRON HCL 4 MG PO TABS: 4.0000 mg | ORAL_TABLET | Freq: Four times a day (QID) | ORAL | Status: DC | PRN

## 2017-02-05 MED ORDER — FOLIC ACID 1 MG PO TABS
1.0000 mg | ORAL_TABLET | Freq: Every day | ORAL | Status: DC
  Administered 2017-02-06: 1 mg via ORAL
  Filled 2017-02-05: qty 1

## 2017-02-05 MED ORDER — SODIUM CHLORIDE 0.9 % IV SOLN: 500.0000 mg | Freq: Two times a day (BID) | INTRAVENOUS | Status: DC

## 2017-02-05 MED ORDER — ACETAMINOPHEN 325 MG PO TABS: 650.0000 mg | ORAL_TABLET | Freq: Four times a day (QID) | ORAL | Status: DC | PRN

## 2017-02-05 MED ORDER — LORAZEPAM 1 MG PO TABS: 1.0000 mg | ORAL_TABLET | Freq: Four times a day (QID) | ORAL | Status: DC | PRN

## 2017-02-05 MED ORDER — BRIMONIDINE TARTRATE 0.2 % OP SOLN
1.0000 [drp] | Freq: Three times a day (TID) | OPHTHALMIC | Status: DC
  Administered 2017-02-05 – 2017-02-06 (×2): 1 [drp] via OPHTHALMIC
  Filled 2017-02-05 (×2): qty 5

## 2017-02-05 MED ORDER — HALOPERIDOL LACTATE 5 MG/ML IJ SOLN
INTRAMUSCULAR | Status: AC
  Administered 2017-02-05: 5 mg
  Filled 2017-02-05: qty 1

## 2017-02-05 MED ORDER — VITAMIN B-1 100 MG PO TABS
100.0000 mg | ORAL_TABLET | Freq: Every day | ORAL | Status: DC
  Administered 2017-02-06: 100 mg via ORAL
  Filled 2017-02-05: qty 1

## 2017-02-05 MED ORDER — ADULT MULTIVITAMIN W/MINERALS CH
1.0000 | ORAL_TABLET | Freq: Every day | ORAL | Status: DC
  Administered 2017-02-06: 1 via ORAL
  Filled 2017-02-05: qty 1

## 2017-02-05 MED ORDER — ONDANSETRON HCL 4 MG/2ML IJ SOLN: 4.0000 mg | Freq: Four times a day (QID) | INTRAMUSCULAR | Status: DC | PRN

## 2017-02-05 MED ORDER — LORAZEPAM 2 MG/ML IJ SOLN
1.0000 mg | Freq: Once | INTRAMUSCULAR | Status: AC
  Administered 2017-02-05: 1 mg via INTRAVENOUS

## 2017-02-05 MED ORDER — THIAMINE HCL 100 MG/ML IJ SOLN
100.0000 mg | Freq: Every day | INTRAMUSCULAR | Status: DC
  Administered 2017-02-05: 100 mg via INTRAVENOUS
  Filled 2017-02-05: qty 2

## 2017-02-05 MED ORDER — LORAZEPAM 2 MG/ML IJ SOLN: 0.0000 mg | Freq: Two times a day (BID) | INTRAMUSCULAR | Status: DC

## 2017-02-05 MED ORDER — TIMOLOL MALEATE 0.5 % OP SOLN
1.0000 [drp] | Freq: Two times a day (BID) | OPHTHALMIC | Status: DC
  Administered 2017-02-05 – 2017-02-06 (×2): 1 [drp] via OPHTHALMIC
  Filled 2017-02-05 (×2): qty 5

## 2017-02-05 MED ORDER — LORAZEPAM 2 MG/ML IJ SOLN
1.0000 mg | Freq: Once | INTRAMUSCULAR | Status: AC
  Administered 2017-02-05: 1 mg via INTRAVENOUS
  Filled 2017-02-05: qty 1

## 2017-02-05 MED ORDER — SODIUM CHLORIDE 0.9 % IV SOLN
500.0000 mg | Freq: Two times a day (BID) | INTRAVENOUS | Status: DC
  Administered 2017-02-05 – 2017-02-06 (×2): 500 mg via INTRAVENOUS
  Filled 2017-02-05 (×3): qty 5

## 2017-02-05 MED ORDER — SODIUM CHLORIDE 0.9 % IV SOLN
500.0000 mg | Freq: Once | INTRAVENOUS | Status: AC
  Administered 2017-02-05: 500 mg via INTRAVENOUS
  Filled 2017-02-05: qty 5

## 2017-02-05 MED ORDER — ENOXAPARIN SODIUM 40 MG/0.4ML ~~LOC~~ SOLN
40.0000 mg | SUBCUTANEOUS | Status: DC
  Filled 2017-02-05: qty 0.4

## 2017-02-05 MED ORDER — SODIUM CHLORIDE 0.9 % IV SOLN
INTRAVENOUS | Status: DC
  Administered 2017-02-05 (×3): via INTRAVENOUS

## 2017-02-05 MED ORDER — LORAZEPAM 2 MG/ML IJ SOLN: 0.0000 mg | Freq: Four times a day (QID) | INTRAMUSCULAR | Status: DC

## 2017-02-05 MED ORDER — AMMONIA AROMATIC IN INHA
0.3000 mL | RESPIRATORY_TRACT | Status: DC | PRN
  Filled 2017-02-05: qty 10

## 2017-02-05 MED ORDER — DORZOLAMIDE HCL 2 % OP SOLN
1.0000 [drp] | Freq: Three times a day (TID) | OPHTHALMIC | Status: DC
  Administered 2017-02-05 – 2017-02-06 (×3): 1 [drp] via OPHTHALMIC
  Filled 2017-02-05 (×2): qty 10

## 2017-02-05 MED ORDER — LORAZEPAM 2 MG/ML IJ SOLN: 1.0000 mg | Freq: Four times a day (QID) | INTRAMUSCULAR | Status: DC | PRN

## 2017-02-05 MED ORDER — LORAZEPAM 2 MG/ML IJ SOLN: 1.0000 mg | Freq: Once | INTRAMUSCULAR | Status: DC

## 2017-02-05 MED ORDER — SODIUM CHLORIDE 0.9% FLUSH
3.0000 mL | Freq: Two times a day (BID) | INTRAVENOUS | Status: DC
  Administered 2017-02-05 – 2017-02-06 (×3): 3 mL via INTRAVENOUS

## 2017-02-05 MED ORDER — LORAZEPAM 2 MG/ML IJ SOLN
1.0000 mg | INTRAMUSCULAR | Status: DC | PRN
  Administered 2017-02-05: 1 mg via INTRAVENOUS
  Filled 2017-02-05: qty 1

## 2017-02-05 MED ORDER — ACETAMINOPHEN 650 MG RE SUPP: 650.0000 mg | Freq: Four times a day (QID) | RECTAL | Status: DC | PRN

## 2017-02-05 NOTE — ED Notes (Signed)
Patient noted to be having seizure like activity. Admitting paged. EDP [place] advising to hold ativan at this time and await admitting's call

## 2017-02-05 NOTE — ED Notes (Signed)
Patient continues tonic clonic seizure activity

## 2017-02-05 NOTE — Progress Notes (Signed)
Bedside EEG completed, results pending. 

## 2017-02-05 NOTE — ED Notes (Signed)
Pt to xray via stretcher

## 2017-02-05 NOTE — ED Notes (Signed)
Pts friend and caregiver [name] [phone] states he will return in a few hours after resting

## 2017-02-05 NOTE — ED Notes (Signed)
Fall precautions initiated. (Socks,braclet, door sign, floor mat)

## 2017-02-05 NOTE — Progress Notes (Signed)
RT unable to get ABG safely at this point as pt is thrashing in bed at this time

## 2017-02-05 NOTE — ED Notes (Signed)
Admitting advising to hold any ativan at this time until EEG

## 2017-02-05 NOTE — H&P (Signed)
History and Physical    Brent Ramos ASN:[id number] DOB: 1955/07/25 DOA: 02/05/2017   PCP: Patient, No Pcp Per   Attending physician: Marily Memos  Patient coming from/Resides with: Private residence/roommates  Chief Complaint: Recurrent seizures  HPI: Brent Ramos is a 61 y.o. male with medical history significant for seizure disorder on AEDs, alcoholism and glaucoma. Patient has been seen in the ER twice over the past week for seizure activity and was treated with IV Keppra and discharged home. Prior to arrival the patient had 5 witnessed seizures. It was reported he had not taken his Keppra since Wednesday. In the ER patient had one more witnessed tonic-clonic seizure. He had been given Versed prior to arrival and was given Ativan and IV for his presenting seizure activity. His CK is greater than 9000 with a normal serum creatinine. Alcohol level < 5. He currently is obtunded and post ictal.  ED Course:  Vital Signs: BP (!) 136/96   Pulse 75   Temp 97.7 F (36.5 C) (Oral)   Resp 16   Ht 6' (1.829 m)   Wt 99.8 kg (220 lb)   SpO2 98%   BMI 29.84 kg/m  Lab data: Sodium 139, potassium 3.8, chloride 105, CO2 25, glucose 102, BUN 5, creatinine 1.01, anion gap 9, CK 9462, white count 8100 with normal differential, hemoglobin 12.6, platelets 240,000; urinalysis 3 days ago unremarkable except for 5 ketones and 30 of protein.; Alcohol <5 Medications and treatments: Keppra 500 mg IV 1, Ativan 1 mg IV 2 doses, normal saline bolus 1 L  Review of Systems:  **Unable to obtain from patient due to obtunded/postictal state-history obtained from the medical record   Past Medical History:  Diagnosis Date  . Alcohol abuse   . Seizures ([place])     Past Surgical History:  Procedure Laterality Date  . NEPHRECTOMY LIVING DONOR      Social History   Social History  . Marital status: Divorced    Spouse name: N/A  . Number of children: N/A  . Years of education: N/A   Occupational History    . Not on file.   Social History Main Topics  . Smoking status: Current Every Day Smoker    Packs/day: 1.00    Types: Cigarettes  . Smokeless tobacco: Never Used  . Alcohol use Yes     Comment: rarely   . Drug use: No  . Sexual activity: Not on file   Other Topics Concern  . Not on file   Social History Narrative  . No narrative on file    Mobility: Independent Work history: Unemployed/? Disabled   No Known Allergies  Unable to obtain family history from patient as documented above  Prior to Admission medications   Medication Sig Start Date End Date Taking? Authorizing Provider  brimonidine (ALPHAGAN) 0.2 % ophthalmic solution Place 1 drop into both eyes 3 (three) times daily.   Yes [provider]  dorzolamide (TRUSOPT) 2 % ophthalmic solution Place 1 drop into both eyes 3 (three) times daily.   Yes [provider]  timolol (TIMOPTIC) 0.5 % ophthalmic solution Place 1 drop into both eyes 2 (two) times daily.   Yes [provider]  levETIRAcetam (KEPPRA) 500 MG tablet Take 1 tablet (500 mg total) by mouth 2 (two) times daily. 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 Posterior pharynx clear of any exudate or lesions. Neck: normal, supple, no masses, no thyromegaly Respiratory: clear to auscultation bilaterally, no wheezing, no crackles. Normal respiratory effort. No accessory muscle use.  Cardiovascular:  Regular rate and rhythm, no murmurs / rubs / gallops. No extremity edema. 2+ pedal pulses. No carotid bruits.  Abdomen: no tenderness, no masses palpated. No hepatosplenomegaly. Bowel sounds positive.  Musculoskeletal: no clubbing / cyanosis. No joint deformity upper and lower extremities. Good ROM, no contractures. Normal muscle tone.  Skin: no rashes, lesions, ulcers. No induration Neurologic: CN 2-12 appear to be grossly intact based on visual inspection although exam limited by patient's inability to participate. Sensation appears to be intact but exam limited by patient's inability to participate, DTR diminished throughout. Unable to test strength.  Psychiatric: Obtunded/postictal   Labs on Admission: I have personally reviewed following labs and imaging studies  CBC:  Recent Labs Lab 01/31/17 2114 02/03/17 0545 02/05/17 0331  WBC 9.5 6.8 8.1  NEUTROABS  --   --  5.4  HGB 11.9* 11.8* 12.6*  HCT 36.0* 36.4* 37.6*  MCV 84.3 85.4 84.3  PLT 250 217 591   Basic Metabolic Panel:  Recent Labs Lab 01/31/17 2114 02/03/17 0545 02/05/17 0331  NA 139 141 139  K 3.6 3.7 3.8  CL 107 110 105  CO2 24 22 25   GLUCOSE 93 86 102*  BUN 8 8 5*  CREATININE 1.11 1.03 1.01  CALCIUM 8.8* 8.5* 8.9   GFR: Estimated Creatinine Clearance: 95.2 mL/min (by C-G formula based on SCr of 1.01 mg/dL). Liver Function Tests:  Recent Labs Lab 02/03/17 0545  AST 134*  ALT 29  ALKPHOS 57  BILITOT 0.5  PROT 5.9*  ALBUMIN 3.8   No results for input(s): LIPASE, AMYLASE in the last 168 hours. No results for input(s): AMMONIA in the last 168 hours. Coagulation Profile: No results for input(s): INR, PROTIME in the last 168 hours. Cardiac Enzymes:  Recent Labs Lab 01/31/17 2114 02/05/17 0331  CKTOTAL 1,119* 9,462*   BNP (last 3 results) No results for input(s): PROBNP in the last 8760 hours. HbA1C: No results for input(s): HGBA1C in the last 72 hours. CBG:  Recent Labs Lab 01/31/17 2046  02/03/17 0556 02/05/17 0148  GLUCAP 94 77 110*   Lipid Profile: No results for input(s): CHOL, HDL, LDLCALC, TRIG, CHOLHDL, LDLDIRECT in the last 72 hours. Thyroid Function Tests: No results for input(s): TSH, T4TOTAL, FREET4, T3FREE, THYROIDAB in the last 72 hours. Anemia Panel: No results for input(s): VITAMINB12, FOLATE, FERRITIN, TIBC, IRON, RETICCTPCT in the last 72 hours. Urine analysis:    Component Value Date/Time   COLORURINE YELLOW 01/31/2017 [place] 01/31/2017 2355   LABSPEC 1.018 01/31/2017 2355   PHURINE 6.0 01/31/2017 2355   GLUCOSEU NEGATIVE 01/31/2017 2355   HGBUR NEGATIVE 01/31/2017 2355   BILIRUBINUR NEGATIVE 01/31/2017 2355   KETONESUR 5 (A) 01/31/2017 2355   PROTEINUR 30 (A) 01/31/2017 2355   UROBILINOGEN 1.0 05/20/2011 1710  NITRITE NEGATIVE 01/31/2017 2355   LEUKOCYTESUR NEGATIVE 01/31/2017 2355   Sepsis Labs: @LABRCNTIP (procalcitonin:4,lacticidven:4) )No results found for this or any previous visit (from the past 240 hour(s)).   Radiological Exams on Admission: No results found.  EKG: (Independently reviewed) sinus rhythm with ventricular rate 83 bpm, QTC 476 ms, voltage criteria are met for LVH, no acute ischemic changes.  Assessment/Plan Principal Problem:     Seizure disorder/Recurrent seizures/ Nonadherence to medication  -Presented to ER after 5 witnessed episodes of tonic-clonic seizure activity with reports of patient not taking prescribed AEDs since Wednesday -Currently obtunded and post ictal -EEG to rule out underlying status epilepticus -IV Keppra-Follow-up on serum levels obtained in the ER -CK greater than 9000 -NPO until alert  **has had at least 4-5 tonic-clonic seizures primarily involving left side w/ post ictal lethargy and agitation prior to next episode. STA EEG pending as is ABG. Will give Ativan x 1 IV and have changed to SDU. Suspect will actually need ICU- Neurology/Kirkpatrick consulted.  Active  Problems:   Alcohol abuse/Polysubstance abuse -Alcohol <5 -CIWA -UDS    Rhabdomyolysis/Dehydration -CK greater than 9000-repeat in a.m. -Normal saline at 150/hr    Glaucoma -Continue preadmission eyedrops    Abnormal EKG -LVH criteria met on EKG -Currently quite sedated with borderline hypertensive systolic blood pressure readings with diastolic greater than 90 c/w diastolic hypertension -ECHO -Monitor for elevated blood pressure after less sedated/more alert      DVT prophylaxis: Lovenox Code Status: Full Family Communication: No one at bedside  Disposition Plan: Home Consults called: Neurology/Kirkpatrick    Joziah Dollins L. ANP-BC [facility] Pager [phone]   If 7PM-7AM, please contact night-coverage [email or web address] Password [facility]  02/05/2017, 7:43 AM

## 2017-02-05 NOTE — ED Notes (Signed)
Patient opened eyes and states "I have to pee". Patient following commands and using urinal.

## 2017-02-05 NOTE — ED Notes (Signed)
Went to place yellow fall socks on pt. Pt stated "i dont need those, I have some." assured pt his socks would be placed on the counter and he would have them when he was ready to be discharged.

## 2017-02-05 NOTE — Progress Notes (Signed)
Pt in bed resting quietly at this time. No C/O's of pain voice. He is calm and cooperative. Family/friend at bedside. No restrain being use

## 2017-02-05 NOTE — ED Notes (Signed)
Regular tray ordered.

## 2017-02-05 NOTE — ED Notes (Signed)
Patient opening eyes and withdrawing from pain.

## 2017-02-05 NOTE — ED Notes (Signed)
Family friend at bedside.

## 2017-02-05 NOTE — Progress Notes (Signed)
RT re-assessed pt for BAG at this time pt continues to move around in bed and is not safe for RT to obtain at this time . RT will continue to monitor

## 2017-02-05 NOTE — Progress Notes (Signed)
Patient arrived from the ED to 4E room 02.  Telemetry monitor applied and CCMD notified.  Patient oriented to unit and room to include call light and phone.  Will continue to monitor.

## 2017-02-05 NOTE — ED Notes (Signed)
Admitting aware of intermittent seizure activity, and unresponsiveness to pain at this time

## 2017-02-05 NOTE — Plan of Care (Signed)
Problem: Pain Managment: Goal: General experience of comfort will improve Outcome: Progressing Patient does not complain of any pain currently

## 2017-02-05 NOTE — Consult Note (Signed)
NEURO HOSPITALIST CONSULT NOTE   Requestig physician: Dr. Marily Memos   Reason for Consult: seizure due to non-complaince   History obtained from:  Nurse and chart HPI:                                                                                                                                          Brent Ramos is an 61 y.o. male medical history significant for seizure disorder on AEDs, alcoholism and glaucoma. Patient has been seen in the ER twice over the past week for seizure activity and was treated with IV Keppra and discharged home. Prior to arrival the patient had 5 witnessed seizures. It was reported he had not taken his Keppra since Wednesday. In the ER patient had one more witnessed tonic-clonic seizure. He had been given Versed prior to arrival and was given Ativan and IV for his presenting seizure activity. His CK is greater than 9000 with a normal serum creatinine. Alcohol level < 5.  Currently patient is blinking to threat, withdrawing from ammonia noxious stimuli and not showing any ictal activity. EEG has been ordered.   Past Medical History:  Diagnosis Date  . Alcohol abuse   . Seizures ([place])     Past Surgical History:  Procedure Laterality Date  . NEPHRECTOMY LIVING DONOR      No family history on file.   Social History:  reports that he has been smoking Cigarettes.  He has been smoking about 1.00 pack per day. He has never used smokeless tobacco. He reports that he drinks alcohol. He reports that he does not use drugs.  No Known Allergies  MEDICATIONS:                                                                                                                     Current Facility-Administered Medications  Medication Dose Route Frequency Provider Last Rate Last Dose  . 0.9 %  sodium chloride infusion   Intravenous Continuous Ivor Costa, MD 150 mL/hr at 02/05/17 0735    . acetaminophen (TYLENOL) tablet 650 mg  650 mg Oral Q6H PRN  Samella Parr, NP       Or  . acetaminophen (TYLENOL) suppository 650 mg  650 mg Rectal Q6H PRN Samella Parr, NP      .  brimonidine (ALPHAGAN) 0.2 % ophthalmic solution 1 drop  1 drop Both Eyes TID Samella Parr, NP      . dorzolamide (TRUSOPT) 2 % ophthalmic solution 1 drop  1 drop Both Eyes TID Samella Parr, NP      . enoxaparin (LOVENOX) injection 40 mg  40 mg Subcutaneous Q24H Samella Parr, NP      . folic acid (FOLVITE) tablet 1 mg  1 mg Oral Daily Ivor Costa, MD      . levETIRAcetam (KEPPRA) 500 mg in sodium chloride 0.9 % 100 mL IVPB  500 mg Intravenous Q12H Ivor Costa, MD      . LORazepam (ATIVAN) injection 0-4 mg  0-4 mg Intravenous Q6H Ivor Costa, MD       Followed by  . [START ON 02/07/2017] LORazepam (ATIVAN) injection 0-4 mg  0-4 mg Intravenous Q12H Ivor Costa, MD      . LORazepam (ATIVAN) injection 1 mg  1 mg Intravenous Q2H PRN Ivor Costa, MD   1 mg at 02/05/17 0919  . LORazepam (ATIVAN) tablet 1 mg  1 mg Oral Q6H PRN Ivor Costa, MD       Or  . LORazepam (ATIVAN) injection 1 mg  1 mg Intravenous Q6H PRN Ivor Costa, MD      . LORazepam (ATIVAN) injection 1 mg  1 mg Intravenous Once Samella Parr, NP      . multivitamin with minerals tablet 1 tablet  1 tablet Oral Daily Ivor Costa, MD      . ondansetron [facility]) tablet 4 mg  4 mg Oral Q6H PRN Samella Parr, NP       Or  . ondansetron [facility]) injection 4 mg  4 mg Intravenous Q6H PRN Samella Parr, NP      . sodium chloride flush (NS) 0.9 % injection 3 mL  3 mL Intravenous Q12H Samella Parr, NP      . thiamine (VITAMIN B-1) tablet 100 mg  100 mg Oral Daily Ivor Costa, MD       Or  . thiamine (B-1) injection 100 mg  100 mg Intravenous Daily Ivor Costa, MD      . timolol (TIMOPTIC) 0.5 % ophthalmic solution 1 drop  1 drop Both Eyes BID Samella Parr, NP       Current Outpatient Prescriptions  Medication Sig Dispense Refill  . brimonidine (ALPHAGAN) 0.2 % ophthalmic solution Place 1 drop into both  eyes 3 (three) times daily.    . dorzolamide (TRUSOPT) 2 % ophthalmic solution Place 1 drop into both eyes 3 (three) times daily.    . timolol (TIMOPTIC) 0.5 % ophthalmic solution Place 1 drop into both eyes 2 (two) times daily.    Marland Kitchen levETIRAcetam (KEPPRA) 500 MG tablet Take 1 tablet (500 mg total) by mouth 2 (two) times daily. 15 tablet 0      ROS:  History obtained from the patient  General ROS: negative for - chills, fatigue, fever, night sweats, weight gain or weight loss Psychological ROS: negative for - behavioral disorder, hallucinations, memory difficulties, mood swings or suicidal ideation Ophthalmic ROS: negative for - blurry vision, double vision, eye pain or loss of vision ENT ROS: negative for - epistaxis, nasal discharge, oral lesions, sore throat, tinnitus or vertigo Allergy and Immunology ROS: negative for - hives or itchy/watery eyes Hematological and Lymphatic ROS: negative for - bleeding problems, bruising or swollen lymph nodes Endocrine ROS: negative for - galactorrhea, hair pattern changes, polydipsia/polyuria or temperature intolerance Respiratory ROS: negative for - cough, hemoptysis, shortness of breath or wheezing Cardiovascular ROS: negative for - chest pain, dyspnea on exertion, edema or irregular heartbeat Gastrointestinal ROS: negative for - abdominal pain, diarrhea, hematemesis, nausea/vomiting or stool incontinence Genito-Urinary ROS: negative for - dysuria, hematuria, incontinence or urinary frequency/urgency Musculoskeletal ROS: negative for - joint swelling or muscular weakness Neurological ROS: as noted in HPI Dermatological ROS: negative for rash and skin lesion changes   Blood pressure (!) 158/94, pulse 76, temperature 97.7 F (36.5 C), temperature source Oral, resp. rate 17, height 6' (1.829 m), weight 99.8 kg (220 lb),  SpO2 94 %.   Neurologic Examination:                                                                                                      HEENT-  Normocephalic, no lesions, without obvious abnormality.  Normal external eye and conjunctiva.  Normal TM's bilaterally.  Normal auditory canals and external ears. Normal external nose, mucus membranes and septum.  Normal pharynx. Cardiovascular- S1, S2 normal, pulses palpable throughout   Lungs- chest clear, no wheezing, rales, normal symmetric air entry Abdomen- normal findings: bowel sounds normal Extremities- no edema Lymph-no adenopathy palpable Musculoskeletal-no joint tenderness, deformity or swelling Skin-warm and dry, no hyperpigmentation, vitiligo, or suspicious lesions  Neurological Examination Mental Status: Currently drowsy, but reacting to noxious stimuli. Not talking but blinking to threat Cranial Nerves: II: blinks to threat  III,IV, VI: doll's intact V,VII: smile symmetric, facial light touch sensation normal bilaterally VIII: hearing normal bilaterally  Motor: Moving all extremities spontanilusly Sensory: Pinprick and light touch intact throughout, bilaterally Deep Tendon Reflexes: depressed throughout Plantars: Right: downgoing   Left: downgoing Cerebellar: Unable to obtain Gait: unable to obtain      Lab Results: Basic Metabolic Panel:  Recent Labs Lab 01/31/17 2114 02/03/17 0545 02/05/17 0331  NA 139 141 139  K 3.6 3.7 3.8  CL 107 110 105  CO2 24 22 25   GLUCOSE 93 86 102*  BUN 8 8 5*  CREATININE 1.11 1.03 1.01  CALCIUM 8.8* 8.5* 8.9    Liver Function Tests:  Recent Labs Lab 02/03/17 0545  AST 134*  ALT 29  ALKPHOS 57  BILITOT 0.5  PROT 5.9*  ALBUMIN 3.8   No results for input(s): LIPASE, AMYLASE in the last 168 hours. No results for input(s): AMMONIA in the last 168 hours.  CBC:  Recent Labs Lab 01/31/17 2114 02/03/17 0545 02/05/17 0331  WBC  9.5 6.8 8.1  NEUTROABS  --   --  5.4   HGB 11.9* 11.8* 12.6*  HCT 36.0* 36.4* 37.6*  MCV 84.3 85.4 84.3  PLT 250 217 240    Cardiac Enzymes:  Recent Labs Lab 01/31/17 2114 02/05/17 0331  CKTOTAL 1,119* 9,462*    Lipid Panel: No results for input(s): CHOL, TRIG, HDL, CHOLHDL, VLDL, LDLCALC in the last 168 hours.  CBG:  Recent Labs Lab 01/31/17 2046 02/03/17 0556 02/05/17 0148  GLUCAP 94 7 110*    Microbiology: Results for orders placed or performed during the hospital encounter of 05/20/11  Urine culture     Status: None   Collection Time: 05/20/11  5:10 PM  Result Value Ref Range Status   Specimen Description URINE, CLEAN CATCH  Final   Special Requests NONE  Final   Culture  Setup Time [id number]  Final   Colony Count >=100,000 COLONIES/ML  Final   Culture ESCHERICHIA COLI  Final   Report Status 05/22/2011 FINAL  Final   Organism ID, Bacteria ESCHERICHIA COLI  Final      Susceptibility   Escherichia coli - MIC*    AMPICILLIN <=2 Sensitive     CEFAZOLIN <=4 Sensitive     CEFTRIAXONE <=1 Sensitive     CIPROFLOXACIN <=0.25 Sensitive     GENTAMICIN <=1 Sensitive     LEVOFLOXACIN 1 Sensitive     NITROFURANTOIN <=16 Sensitive     TOBRAMYCIN <=1 Sensitive     TRIMETH/SULFA >=320 Resistant     * ESCHERICHIA COLI    Coagulation Studies: No results for input(s): LABPROT, INR in the last 72 hours.  Imaging: No results found.     Assessment and plan per attending neurologist  Etta Quill PA-C [facility] [phone]  02/05/2017, 10:01 AM  I have seen the patient reviewed the above note. He moves both sides purposefully. He blinks to threat bilaterally, but does not follow commands.  Assessment/Plan: 61 YO male with break through seizure secondary to non-compliance. Last dose Keppra 5 days ago. He appears postictal and sedated at this time, but low suspicion for ongoing seizures.  1) restart Keppra at home dose given noncompliance, 500 mg twice a day 2) EEG 3) neurology  will follow  Roland Rack, MD [facility] [phone]  If 7pm- 7am, please page neurology on call as listed in [place].

## 2017-02-05 NOTE — ED Notes (Signed)
Pt's friend [name] at bedside seen removing jewelry from L wrist and placing it in his bag

## 2017-02-05 NOTE — Progress Notes (Signed)
This is a no charge note  Pending admission per Dr. Christy Gentles  61 year old male with a past medical history of alcohol abuse, seizure, education noncompliance, who presents with seizure.  Pt has hx of seizure, but not common melanocytic take his Keppra. Per "friend", pt last took [place] on Wednesday. He was seen in ED twice recently and was given IV load of keppra.  This is the third time. He had 5 episodes of recurrent seizure today. Patient had negative CT of her head on 01/31/17. Pt was found to have CP 9462, alcohol level less than 5. Patient is admitted to telemetry bed as inpatient. Pt was loaded with 500 mg of Keppra. Will give another dose to add up of 1g of keppra loading, then 500 mg bid. IVF for rhabdomyolysis.  Ivor Costa, MD  [facility] Pager [phone]  If 7PM-7AM, please contact night-coverage [email or web address] Password [email or web address] 02/05/2017, 6:10 AM

## 2017-02-05 NOTE — ED Triage Notes (Addendum)
Pt transported from home by EMS for seizure like activity.  Per EMS pt having full body jerking with noncompliance with any attempts at care.  Pt seen x 2 recently for same. IV est by EMS Versed given 2.5 PTA and 2.5 just after arrival with full body jerking. Pt did pull out any attempts at securing airway.  Per EMS upon arrival pt states "I need that shot" Per "friend" pt last took [place] Wednesday, was seen yesterday for seizure activity and given IV dose.  Per friend at bedside medication is to delivered Monday.

## 2017-02-05 NOTE — ED Notes (Signed)
Patient requesting something to eat.

## 2017-02-05 NOTE — Procedures (Signed)
ELECTROENCEPHALOGRAM REPORT  Date of Study: 02/05/2017  Patient's Name: Brent Ramos MRN: [id number] Date of Birth: [birth year]  Referring Provider: Erin Hearing, NP  Clinical History: This is a 61 year old man with recurrent seizures.  Medications: acetaminophen (TYLENOL) tablet 650 mg  ammonia inhalant 0.3 mL  brimonidine (ALPHAGAN) 0.2 % ophthalmic solution 1 drop  dorzolamide (TRUSOPT) 2 % ophthalmic solution 1 drop  enoxaparin (LOVENOX) injection 40 mg  folic acid (FOLVITE) tablet 1 mg  levETIRAcetam (KEPPRA) 500 mg in sodium chloride 0.9 % 100 mL IVPB  multivitamin with minerals tablet 1 tablet  thiamine (B-1) injection 100 mg  thiamine (VITAMIN B-1) tablet 100 mg  timolol (TIMOPTIC) 0.5 % ophthalmic solution 1 drop   Technical Summary: A multichannel digital EEG recording measured by the international 10-20 system with electrodes applied with paste and impedances below 5000 ohms performed in our laboratory with EKG monitoring in a predominantly drowsy and asleep patient.  Hyperventilation and photic stimulation were not performed.  The digital EEG was referentially recorded, reformatted, and digitally filtered in a variety of bipolar and referential montages for optimal display.    Description: The patient is predominantly drowsy and asleep during the recording. There is no clear posterior dominant rhythm. The record is symmetric.  There is an excess amount of diffuse low voltage beta activity seen throughout the recording. During drowsiness and sleep, there is an increase in theta and delta slowing of the background. Vertex waves and low voltage sleep spindles were seen. He is lethargic and minimally responsive to noxious stimulation, with slight increase in faster frequencies noted on EEG with stimulation. Hyperventilation and photic stimulation were not performed.  There were no epileptiform discharges or electrographic seizures seen.    EKG lead was  unremarkable.  Impression: This predominantly drowsy and asleep EEG is normal except for excess amount of diffuse low voltage beta activity.  Clinical Correlation: Diffuse low voltage beta activity is commonly seen with sedating medications such as benzodiazepines.  In the absence of sedating medications, anxiety and hyperthyroidism may produce generalized beta activity.  The absence of epileptiform discharges does not exclude a clinical diagnosis of epilepsy. There were no electrographic seizures in this study. Clinical correlation is advised.   Ellouise Newer, M.D.

## 2017-02-05 NOTE — ED Notes (Signed)
Patient resting now. Unresponsive to pain. Responsive to ammonia inhalent.

## 2017-02-05 NOTE — ED Notes (Addendum)
Since [phone] patient has had tonic clonic seizure activity lasting 30sec-1 minute x 5 times. Patient given ativan 1 mg. Patient remains in post ictal agitated state.

## 2017-02-05 NOTE — ED Provider Notes (Signed)
[facility] DEPT Provider Note   CSN: [id number] Arrival date & time: 02/05/17  0138     History   Chief Complaint Chief Complaint  Patient presents with  . Seizures  LEVEL 5 CAVEAT DUE TO ACUITY OF CONDITION   HPI Brent Ramos is a 61 y.o. male.  The history is provided by the EMS personnel and a friend.  Seizures   This is a recurrent problem. The most recent episode lasted more than 5 minutes. The episode was witnessed. The seizure(s) had no focality. There has been no fever.   Pt presents for evaluation of seizures Per friend, pt had about 5 seizures recently.  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 Alcohol abuse   . Seizures ([place])     There are no active problems to display for this patient.   Past Surgical History:  Procedure Laterality Date  . NEPHRECTOMY LIVING DONOR         Home Medications    Prior to Admission medications   Medication Sig Start Date End Date Taking? Authorizing Provider  brimonidine (ALPHAGAN) 0.2 % ophthalmic solution Place 1 drop into both eyes 3 (three) times daily.    [provider]  dorzolamide (TRUSOPT) 2 % ophthalmic solution Place 1 drop into both eyes 3 (three) times daily.    [provider]  levETIRAcetam (KEPPRA) 500 MG tablet Take 1 tablet (500 mg total) by mouth 2 (two) times daily. 02/01/17   Kinnie Feil, PA-C  timolol (TIMOPTIC) 0.5 % ophthalmic solution Place 1 drop into both eyes 2 (two) times daily.    [provider]    Family History No family history on file.  Social History Social History  Substance Use Topics  . Smoking status: Current Every Day Smoker    Packs/day: 1.00   Types: Cigarettes  . Smokeless tobacco: Never Used  . Alcohol use Yes     Comment: rarely      Allergies   Patient has no known allergies.   Review of Systems Review of Systems  Unable to perform ROS: Acuity of condition  Neurological: Positive for seizures.     Physical Exam Updated Vital Signs BP (!) 136/93 (BP Location: Right Arm)   Pulse 80   Temp 97.7 F (36.5 C) (Oral)   Resp 15   Ht 1.829 m (6')   Wt 99.8 kg (220 lb)   SpO2 96%   BMI 29.84 kg/m   Physical Exam CONSTITUTIONAL: disheveled, pt is somnolent HEAD: Normocephalic/atraumatic, no visible trauma EYES: no nystagmus ENMT: Mucous membranes moist NECK: supple no meningeal signs CV: S1/S2 noted, no murmurs/rubs/gallops noted LUNGS: Lungs are clear to auscultation bilaterally, no apparent distress ABDOMEN: soft, nontender  NEURO: Pt is somnolent, will intermittently move all extremities EXTREMITIES: pulses normal/equal, full ROM, no signs of trauma SKIN: warm, color normal PSYCH: unable to assess  ED Treatments / Results  Labs (all labs ordered are listed, but only abnormal results are displayed) Labs Reviewed  CBC WITH DIFFERENTIAL/PLATELET - Abnormal; Notable for the following:       Result Value   Hemoglobin 12.6 (*)    HCT 37.6 (*)    All  other components within normal limits  BASIC METABOLIC PANEL - Abnormal; Notable for the following:    Glucose, Bld 102 (*)    BUN 5 (*)    All other components within normal limits  CK - Abnormal; Notable for the following:    Total CK 9,462 (*)    All other components within normal limits  CBG MONITORING, ED - Abnormal; Notable for the following:    Glucose-Capillary 110 (*)    All other components within normal limits  ETHANOL  LEVETIRACETAM LEVEL    EKG  EKG Interpretation  Date/Time:  Monday February 05 2017 01:41:07 EDT Ventricular Rate:  83 PR Interval:    QRS Duration: 94 QT Interval:  405 QTC Calculation: 476 R Axis:   69 Text  Interpretation:  Sinus rhythm RSR' in V1 or V2, probably normal variant Consider left ventricular hypertrophy Anterior ST elevation, probably due to LVH Borderline prolonged QT interval No significant change since last tracing Confirmed by Ripley Fraise [phone]) on 02/05/2017 1:59:21 AM       Radiology No results found.  Procedures Procedures    Medications Ordered in ED Medications  LORazepam (ATIVAN) injection 1 mg (not administered)  0.9 %  sodium chloride infusion (not administered)  LORazepam (ATIVAN) tablet 1 mg (not administered)    Or  LORazepam (ATIVAN) injection 1 mg (not administered)  thiamine (VITAMIN B-1) tablet 100 mg (not administered)    Or  thiamine (B-1) injection 100 mg (not administered)  folic acid (FOLVITE) tablet 1 mg (not administered)  multivitamin with minerals tablet 1 tablet (not administered)  LORazepam (ATIVAN) injection 0-4 mg (not administered)    Followed by  LORazepam (ATIVAN) injection 0-4 mg (not administered)  levETIRAcetam (KEPPRA) 500 mg in sodium chloride 0.9 % 100 mL IVPB (0 mg Intravenous Stopped 02/05/17 0403)  LORazepam (ATIVAN) injection 1 mg (1 mg Intravenous Given 02/05/17 0315)  sodium chloride 0.9 % bolus 1,000 mL (1,000 mLs Intravenous New Bag/Given 02/05/17 0602)  LORazepam (ATIVAN) injection 1 mg (1 mg Intravenous Given 02/05/17 0415)     Initial Impression / Assessment and Plan / ED Course  I have reviewed the triage vital signs and the nursing notes.  Pertinent labs & imaging results that were available during my care of the patient were reviewed by me and considered in my medical decision making (see chart for details).     3:28 AM Pt with h/o seizure disorder, not taking meds recently now with seizure Friend reported he had a 20 minute seizure prior to arrival When I was in room patient began thrashing around in bed but was able to be calmed down.  It did not appear c/w generalized tonic/clonic seizure.  IV ativan  ordered IV keppra ordered Will follow closely 6:11 AM Pt resting comfortably No new seizure activity Vitals appropriate He received total of versed 5mg , ativan 2mg  He was noted to be in Advance, likely from recent increased and prolonged seizures  Will admit  D/w dr Blaine Hamper for admission   Pt afebrile, doubt meningitis No signs of head trauma Defer imaging for now  Final Clinical Impressions(s) / ED Diagnoses   Final diagnoses:  Seizure-like activity ([place])  Dehydration  Non-traumatic rhabdomyolysis    New Prescriptions New Prescriptions   No medications on file     Ripley Fraise, MD 02/05/17 [phone]

## 2017-02-05 NOTE — ED Notes (Signed)
Admitting at bedside 

## 2017-02-05 NOTE — ED Notes (Signed)
Patient continuing seizure activity. Admitting paged.

## 2017-02-05 NOTE — ED Notes (Signed)
No further seizure activity noted. Pt resting on stretcher with eyes closed RR even and unlabored. NAD

## 2017-02-05 NOTE — ED Notes (Signed)
eeg at bedside 

## 2017-02-06 ENCOUNTER — Other Ambulatory Visit (HOSPITAL_COMMUNITY): Payer: Self-pay

## 2017-02-06 LAB — HIV ANTIBODY (ROUTINE TESTING W REFLEX): HIV Screen 4th Generation wRfx: NONREACTIVE

## 2017-02-06 LAB — COMPREHENSIVE METABOLIC PANEL
ALBUMIN: 3.2 g/dL — AB (ref 3.5–5.0)
ALK PHOS: 53 U/L (ref 38–126)
ALT: 27 U/L (ref 17–63)
AST: 57 U/L — ABNORMAL HIGH (ref 15–41)
Anion gap: 4 — ABNORMAL LOW (ref 5–15)
BUN: 7 mg/dL (ref 6–20)
CALCIUM: 8.3 mg/dL — AB (ref 8.9–10.3)
CO2: 27 mmol/L (ref 22–32)
CREATININE: 0.97 mg/dL (ref 0.61–1.24)
Chloride: 109 mmol/L (ref 101–111)
GFR calc Af Amer: >60 mL/min (ref >60)
GFR calc non Af Amer: >60 mL/min (ref >60)
GLUCOSE: 97 mg/dL (ref 65–99)
Potassium: 3.7 mmol/L (ref 3.5–5.1)
SODIUM: 140 mmol/L (ref 135–145)
Total Bilirubin: 0.7 mg/dL (ref 0.3–1.2)
Total Protein: 5.1 g/dL — ABNORMAL LOW (ref 6.5–8.1)

## 2017-02-06 LAB — CBC
HCT: 33.9 % — ABNORMAL LOW (ref 39.0–52.0)
Hemoglobin: 10.9 g/dL — ABNORMAL LOW (ref 13.0–17.0)
MCH: 27.7 pg (ref 26.0–34.0)
MCHC: 32.2 g/dL (ref 30.0–36.0)
MCV: 86 fL (ref 78.0–100.0)
PLATELETS: 200 10*3/uL (ref 150–400)
RBC: 3.94 MIL/uL — ABNORMAL LOW (ref 4.22–5.81)
RDW: 14.8 % (ref 11.5–15.5)
WBC: 5.1 10*3/uL (ref 4.0–10.5)

## 2017-02-06 LAB — CK: CK TOTAL: 3732 U/L — AB (ref 49–397)

## 2017-02-06 LAB — LEVETIRACETAM LEVEL: Levetiracetam Lvl: 4.7 ug/mL — ABNORMAL LOW (ref 10.0–40.0)

## 2017-02-06 MED ORDER — LEVETIRACETAM 500 MG PO TABS
500.0000 mg | ORAL_TABLET | Freq: Two times a day (BID) | ORAL | Status: DC
  Administered 2017-02-06: 500 mg via ORAL
  Filled 2017-02-06: qty 1

## 2017-02-06 NOTE — Evaluation (Signed)
Physical Therapy Evaluation Patient Details Name: Brent Ramos MRN: [id number] DOB: 26-Nov-1955 Today's Date: 02/06/2017   History of Present Illness  Brent Ramos is an 61 y.o. male medical history significant for seizure disorder on AEDs, alcoholism and glaucoma. Patient has been seen in the ER twice over the past week for seizure activity and was treated with IV Keppra and discharged home. Prior to arrival the patient had 5 witnessed seizures. Alcohol level <5.   Past Medical History:  Diagnosis Date  . Alcohol abuse   . Seizures (Onset)    "has had them for years" (02/05/2017)    Clinical Impression  Pt admitted with above diagnosis. Pt currently with functional limitations due to the deficits listed below (see PT Problem List). Pt was able to ambulate with fair safety awareness without device.  Discussed use of cane for incr support and pt and caregiver receptive.  Will follow acutely.  Pt will benefit from skilled PT to increase their independence and safety with mobility to allow discharge to the venue listed below.      Follow Up Recommendations Home health PT;Supervision - Intermittent    Equipment Recommendations  Cane    Recommendations for Other Services       Precautions / Restrictions Precautions Precautions: Fall Precaution Comments: Has special glasses he wears as he is legally blind.   Restrictions Weight Bearing Restrictions: No      Mobility  Bed Mobility Overal bed mobility: Independent                Transfers Overall transfer level: Independent                  Ambulation/Gait Ambulation/Gait assistance: Min guard Ambulation Distance (Feet): 150 Feet Assistive device: None Gait Pattern/deviations: Decreased stride length;Decreased step length - right;Step-through pattern;Decreased stance time - right   Gait velocity interpretation: <1.8 ft/sec, indicative of risk for recurrent falls General Gait Details: Pt overall balanced except in  non controlled environments and when pt would ambulate too quickly.  Pt needed cues to slow down at times.  Pt without significant LOB in controlled environement today.  Discussed cane with pt as [name] states the [place] [employer] was going to get [name] a cane for the blind but he is unsure if it will be good for pt.  Discussed quad cane but this PT feels that it would be difficult for pt to learn to use it correctly.  Decided a straight cane may be best.   Stairs            Wheelchair Mobility    Modified Rankin (Stroke Patients Only)       Balance Overall balance assessment: Needs assistance         Standing balance support: No upper extremity supported;During functional activity Standing balance-Leahy Scale: Fair Standing balance comment: can stand statically and wash hands without UE support             High level balance activites: Direction changes;Turns High Level Balance Comments: min guard assist without LOB             Pertinent Vitals/Pain Pain Assessment: No/denies pain   VSS Home Living Family/patient expects to be discharged to:: Private residence Living Arrangements: Non-relatives/Friends Available Help at Discharge: Personal care attendant;Available PRN/intermittently (10-12 hours day, [name]) Type of Home: House Home Access: Stairs to enter Entrance Stairs-Rails: Right Entrance Stairs-Number of Steps: 2 Home Layout: One level Home Equipment: None      Prior Function  Level of Independence: Needs assistance   Gait / Transfers Assistance Needed: walked without assist.  Needed assist by [name] up and down steps.   ADL's / Homemaking Assistance Needed: needed assist with clothing and cooking        Hand Dominance        Extremity/Trunk Assessment   Upper Extremity Assessment Upper Extremity Assessment: Defer to OT evaluation    Lower Extremity Assessment Lower Extremity Assessment: Overall WFL for tasks assessed    Cervical / Trunk  Assessment Cervical / Trunk Assessment: Normal  Communication   Communication: No difficulties  Cognition Arousal/Alertness: Awake/alert Behavior During Therapy: Flat affect Overall Cognitive Status: History of cognitive impairments - at baseline Area of Impairment: Memory;Safety/judgement                     Memory: Decreased short-term memory   Safety/Judgement: Decreased awareness of deficits;Decreased awareness of safety            General Comments      Exercises     Assessment/Plan    PT Assessment Patient needs continued PT services  PT Problem List Decreased activity tolerance;Decreased balance;Decreased mobility;Decreased cognition;Decreased knowledge of use of DME;Decreased safety awareness       PT Treatment Interventions DME instruction;Gait training;Functional mobility training;Therapeutic activities;Therapeutic exercise;Stair training;Balance training;Patient/family education    PT Goals (Current goals can be found in the Care Plan section)  Acute Rehab PT Goals Patient Stated Goal: to go home PT Goal Formulation: With patient Time For Goal Achievement: 02/20/17 Potential to Achieve Goals: Good    Frequency Min 3X/week   Barriers to discharge        Co-evaluation               AM-PAC PT "6 Clicks" Daily Activity  Outcome Measure Difficulty turning over in bed (including adjusting bedclothes, sheets and blankets)?: None Difficulty moving from lying on back to sitting on the side of the bed? : None Difficulty sitting down on and standing up from a chair with arms (e.g., wheelchair, bedside commode, etc,.)?: None Help needed moving to and from a bed to chair (including a wheelchair)?: None Help needed walking in hospital room?: A Little Help needed climbing 3-5 steps with a railing? : A Lot 6 Click Score: 21    End of Session Equipment Utilized During Treatment: Gait belt Activity Tolerance: Patient limited by fatigue Patient left:  in bed;with call bell/phone within reach;with family/visitor present Nurse Communication: Mobility status PT Visit Diagnosis: Unsteadiness on feet (R26.81);Muscle weakness (generalized) (M62.81)    Time: 4010-2725 PT Time Calculation (min) (ACUTE ONLY): 20 min   Charges:   PT Evaluation $PT Eval Moderate Complexity: 1 Mod     PT G Codes:        Virna Livengood,PT Acute Rehabilitation [phone] (pager)   Denice Paradise 02/06/2017, 11:19 AM

## 2017-02-06 NOTE — Care Management Note (Addendum)
Case Management Note Marvetta Gibbons RN, BSN Unit 4E-Case Manager [phone]  Patient Details  Name: Brent Ramos MRN: [id number] Date of Birth: 1955/07/03  Subjective/Objective:   Pt admitted with seizures                 Action/Plan: PTA pt lived at home- has a caregiver- Brent Ramos- [facility]) pt is legally blind- per PT notes recommendation for HHPT- order placed- however pt does not qualify under medicaid for HHPT-notified MD to see if [facility] would be appropriate for medication compliance/management- MD to place order if agrees.  spoke with pt and caregiver at the bedside- choice offered for [facility] services per pt and caregiver they would like to use [facility] for services- aware that pt does not qualify for HHPT services but may for [facility] if MD agrees- Brent Ramos # is [phone].    Per Brent Ramos pt needs assistance with transportation home- have notified Brent Ramos- CSW for transportation needs.   Expected Discharge Date:  02/06/17               Expected Discharge Plan:  [place]  In-House Referral:  Clinical Social Work  Discharge planning Services  CM Consult  Post Acute Care Choice:  Home Health Choice offered to:  Patient  DME Arranged:  N/A DME Agency:  NA  HH Arranged:  RN [place] Agency:  [facility]  Status of Service:  Completed, signed off  If discussed at [place] of Stay Meetings, dates discussed:    Discharge Disposition: home/ self care   Additional Comments:  02/06/17- 1600- Brent Jurney RN, CM- noted MD did not order [facility] - pt discharged home with caregiver- call made to Brent Ramos at number provided- and explained that pt would not be receiving any HH services- Brent Ramos to let pt know- no HH referral made on discharge as he did not qualify for the HHPT under medicaid and MD did not order [place].   Brent Patricia, RN 02/06/2017, 2:43 PM

## 2017-02-06 NOTE — Progress Notes (Signed)
CSW consulted for taxi ride home- pt is blind and not safe for the bus- no money or family who can drive  Chief Strategy Officer provided to RN- address confirmed  Jorge Ny, [place] Social Worker [phone]

## 2017-02-06 NOTE — Discharge Summary (Signed)
Physician Discharge Summary  Brent Ramos NAT:[id number] DOB: 07-31-1955 DOA: 02/05/2017  PCP: Elwyn Reach, MD  Admit date: 02/05/2017 Discharge date: 02/06/2017  Admitted From: Home  Disposition:  Home  Recommendations for Outpatient Follow-up:  1. Follow up with PCP as needed  Home Health:  none  Equipment/Devices:  Cane   Discharge Condition:  Stable, improved CODE STATUS:  Full code  Diet recommendation:  regular   Brief/Interim Summary:  The patient is a 61 year old male with history of seizure disorder, alcoholism, and glaucoma. There was a delay in the arrival of his Keppra prescription and so he went several days without receiving his antiepileptic. He was brought to the hospital because of multiple missed tonic-clonic seizures. At the time of admission his CK was greater than 9000 with a normal serum creatinine. His alcohol level was negative. He was attended and postictal. He was started on IV Keppra and observed overnight. Neurology was consult. EEG demonstrated diffuse slowing likely related to the multiple doses of Ativan he received in the emergency department for seizures.  By the following day, he was awake, alert, and asking to go home. His Keppra prescription had arrived according to family at bedside. He was discharged home in stable condition.  Discharge Diagnoses:  Principal Problem:   Seizure disorder [facility]) Active Problems:   Rhabdomyolysis   Alcohol abuse   Nonadherence to medication   Dehydration   Recurrent seizures (HCC)   Glaucoma   Abnormal EKG  Seizure disorder/Recurrent seizures/ Nonadherence to medication  -Resume Keppra at home dose  Active Problems:   Alcohol abuse/Polysubstance abuse, no evidence of alcohol withdrawal -Alcohol <5 -UDS positive for benzos as expected    Rhabdomyolysis/Dehydration, related to seizure, and trended down    Glaucoma -Continue preadmission eyedrops    Abnormal EKG with LVH criteria met on EKG -  defer ECHO to PCP as outpatient  Discharge Instructions  Discharge Instructions    Call MD for:  difficulty breathing, headache or visual disturbances    Complete by:  As directed    Call MD for:  extreme fatigue    Complete by:  As directed    Call MD for:  persistant dizziness or light-headedness    Complete by:  As directed    Call MD for:  persistant nausea and vomiting    Complete by:  As directed    Diet general    Complete by:  As directed    Increase activity slowly    Complete by:  As directed        Medication List    TAKE these medications   brimonidine 0.2 % ophthalmic solution Commonly known as:  ALPHAGAN Place 1 drop into both eyes 3 (three) times daily.   dorzolamide 2 % ophthalmic solution Commonly known as:  TRUSOPT Place 1 drop into both eyes 3 (three) times daily.   levETIRAcetam 500 MG tablet Commonly known as:  KEPPRA Take 1 tablet (500 mg total) by mouth 2 (two) times daily.   timolol 0.5 % ophthalmic solution Commonly known as:  TIMOPTIC Place 1 drop into both eyes 2 (two) times daily.      Follow-up Information    Elwyn Reach, MD Follow up.   Specialty:  Internal Medicine Contact information: [address] [address] [phone]          No Known Allergies  Consultations: Neurology   Procedures/Studies: Ct Head Wo Contrast  Result Date: 01/31/2017 CLINICAL DATA:  Head trauma,  fall EXAM: CT HEAD WITHOUT CONTRAST CT CERVICAL SPINE WITHOUT CONTRAST TECHNIQUE: Multidetector CT imaging of the head and cervical spine was performed following the standard protocol without intravenous contrast. Multiplanar CT image reconstructions of the cervical spine were also generated. COMPARISON:  01/30/2014 FINDINGS: CT HEAD FINDINGS Brain: Mild age related brain atrophy. No acute intracranial hemorrhage, mass lesion, infarction, midline shift, or herniation, hydrocephalus, or extra-axial fluid collection. No focal mass effect  or edema. Cisterns are patent. No cerebellar abnormality. Vascular: No hyperdense vessel or unexpected calcification. Skull: Normal. Negative for fracture or focal lesion. Sinuses/Orbits: No acute finding. Other: None. CT CERVICAL SPINE FINDINGS Alignment: Straightened cervical alignment with slight kyphosis may be positional or spasm related. Skull base and vertebrae: No acute fracture. No primary bone lesion or focal pathologic process. Soft tissues and spinal canal: No prevertebral fluid or swelling. No visible canal hematoma. Disc levels: Mid and lower cervical degenerative spondylosis spanning C5-T1. These levels demonstrate disc space narrowing, sclerosis and anterior osteophytes. Mild multilevel facet arthropathy posteriorly. Facets remain aligned. Upper chest: Negative. Other: None. IMPRESSION: Mild brain atrophy without acute intracranial abnormality by noncontrast CT. Cervical degenerative spondylosis without acute osseous finding, fracture, or malalignment. Electronically Signed   By: Jerilynn Mages.  Shick M.D.   On: 01/31/2017 22:18   Ct Cervical Spine Wo Contrast  Result Date: 01/31/2017 CLINICAL DATA:  Head trauma, fall EXAM: CT HEAD WITHOUT CONTRAST CT CERVICAL SPINE WITHOUT CONTRAST TECHNIQUE: Multidetector CT imaging of the head and cervical spine was performed following the standard protocol without intravenous contrast. Multiplanar CT image reconstructions of the cervical spine were also generated. COMPARISON:  01/30/2014 FINDINGS: CT HEAD FINDINGS Brain: Mild age related brain atrophy. No acute intracranial hemorrhage, mass lesion, infarction, midline shift, or herniation, hydrocephalus, or extra-axial fluid collection. No focal mass effect or edema. Cisterns are patent. No cerebellar abnormality. Vascular: No hyperdense vessel or unexpected calcification. Skull: Normal. Negative for fracture or focal lesion. Sinuses/Orbits: No acute finding. Other: None. CT CERVICAL SPINE FINDINGS Alignment:  Straightened cervical alignment with slight kyphosis may be positional or spasm related. Skull base and vertebrae: No acute fracture. No primary bone lesion or focal pathologic process. Soft tissues and spinal canal: No prevertebral fluid or swelling. No visible canal hematoma. Disc levels: Mid and lower cervical degenerative spondylosis spanning C5-T1. These levels demonstrate disc space narrowing, sclerosis and anterior osteophytes. Mild multilevel facet arthropathy posteriorly. Facets remain aligned. Upper chest: Negative. Other: None. IMPRESSION: Mild brain atrophy without acute intracranial abnormality by noncontrast CT. Cervical degenerative spondylosis without acute osseous finding, fracture, or malalignment. Electronically Signed   By: Jerilynn Mages.  Shick M.D.   On: 01/31/2017 22:18   EEG  Subjective:  Feeling well, back to baseline. Would like to go home.  Denies chest pains, shortness of breath, nausea.  Discharge Exam: Vitals:   02/06/17 0400 02/06/17 0759  BP: 128/85   Pulse: 71   Resp: 14   Temp: 98.2 F (36.8 C) 97.7 F (36.5 C)  SpO2: 98%    Vitals:   02/05/17 2005 02/06/17 0000 02/06/17 0400 02/06/17 0759  BP: (!) 131/94 117/82 128/85   Pulse: 66 61 71   Resp: _0 Temp: 97.6 F (36.4 C) 97.8 F (36.6 C) 98.2 F (36.8 C) 97.7 F (36.5 C)  TempSrc: Oral Oral Oral Oral  SpO2: 100% 99% 98%   Weight:   84.7 kg (186 lb 12.8 oz)   Height:        General: Pt is alert, awake, not  in acute distress Cardiovascular: RRR, S1/S2 +, no rubs, no gallops Respiratory: CTA bilaterally, no wheezing, no rhonchi Abdominal: Soft, NT, ND, bowel sounds + Extremities: no edema, no cyanosis   The results of significant diagnostics from this hospitalization (including imaging, microbiology, ancillary and laboratory) are listed below for reference.     Microbiology: No results found for this or any previous visit (from the past 240 hour(s)).   Labs: BNP (last 3 results) No results  for input(s): BNP in the last 8760 hours. Basic Metabolic Panel:  Recent Labs Lab 01/31/17 2114 02/03/17 0545 02/05/17 0331 02/06/17 0305  NA 139 141 139 140  K 3.6 3.7 3.8 3.7  CL 107 110 105 109  CO2 _0 GLUCOSE 93 86 102* 97  BUN 8 8 5* 7  CREATININE 1.11 1.03 1.01 0.97  CALCIUM 8.8* 8.5* 8.9 8.3*   Liver Function Tests:  Recent Labs Lab 02/03/17 0545 02/06/17 0305  AST 134* 57*  ALT 29 27  ALKPHOS 57 53  BILITOT 0.5 0.7  PROT 5.9* 5.1*  ALBUMIN 3.8 3.2*   No results for input(s): LIPASE, AMYLASE in the last 168 hours. No results for input(s): AMMONIA in the last 168 hours. CBC:  Recent Labs Lab 01/31/17 2114 02/03/17 0545 02/05/17 0331 02/06/17 0305  WBC 9.5 6.8 8.1 5.1  NEUTROABS  --   --  5.4  --   HGB 11.9* 11.8* 12.6* 10.9*  HCT 36.0* 36.4* 37.6* 33.9*  MCV 84.3 85.4 84.3 86.0  PLT 250 217 240 200   Cardiac Enzymes:  Recent Labs Lab 01/31/17 2114 02/05/17 0331 02/06/17 0305  CKTOTAL 1,119* 9,462* 3,732*   BNP: Invalid input(s): POCBNP CBG:  Recent Labs Lab 01/31/17 2046 02/03/17 0556 02/05/17 0148  GLUCAP 94 77 110*   D-Dimer No results for input(s): DDIMER in the last 72 hours. Hgb A1c No results for input(s): HGBA1C in the last 72 hours. Lipid Profile No results for input(s): CHOL, HDL, LDLCALC, TRIG, CHOLHDL, LDLDIRECT in the last 72 hours. Thyroid function studies No results for input(s): TSH, T4TOTAL, T3FREE, THYROIDAB in the last 72 hours.  Invalid input(s): FREET3 Anemia work up No results for input(s): VITAMINB12, FOLATE, FERRITIN, TIBC, IRON, RETICCTPCT in the last 72 hours. Urinalysis    Component Value Date/Time   COLORURINE YELLOW 01/31/2017 [place] 01/31/2017 2355   LABSPEC 1.018 01/31/2017 2355   PHURINE 6.0 01/31/2017 2355   GLUCOSEU NEGATIVE 01/31/2017 2355   HGBUR NEGATIVE 01/31/2017 2355   BILIRUBINUR NEGATIVE 01/31/2017 2355   KETONESUR 5 (A) 01/31/2017 2355   PROTEINUR 30  (A) 01/31/2017 2355   UROBILINOGEN 1.0 05/20/2011 1710   NITRITE NEGATIVE 01/31/2017 2355   LEUKOCYTESUR NEGATIVE 01/31/2017 2355   Sepsis Labs Invalid input(s): PROCALCITONIN,  WBC,  LACTICIDVEN   Time coordinating discharge: Over 30 minutes  SIGNED:   Janece Canterbury, MD  [facility] 02/06/2017, 7:07 PM Pager   If 7PM-7AM, please contact night-coverage [email or web address] Password [email or web address]

## 2017-02-06 NOTE — Progress Notes (Signed)
   02/06/17 1000  Clinical Encounter Type  Visited With Patient (friend [name])  Visit Type Initial (Advance Directives filled out and notarized)  Referral From [place] (Requested Bible)  Stress Factors  Patient Stress Factors None identified  Family Stress Factors None identified  Advance Directives (For Healthcare)  Does Patient Have a Medical Advance Directive? Yes  Does patient want to make changes to medical advance directive? (Yes completed today)  Type of Advance Directive (Both)  patient wanted to do Advance Directives. Called and had that completed and copy to Unit Secretary and 3 copies for patient.  Requested Bible and prayer. Patient is hopeful for going home when he is released. Conard Novak, Chaplain

## 2017-02-06 NOTE — Progress Notes (Signed)
Subjective: Patient back to baseline.   Exam: Vitals:   02/06/17 0400 02/06/17 0759  BP: 128/85   Pulse: 71   Resp: 14   Temp: 98.2 F (36.8 C) 97.7 F (36.5 C)  SpO2: 98%    Gen: In bed, NAD Resp: non-labored breathing, no acute distress Abd: soft, nt  Neuro: MS: Awake, alert, gives month as October.  KG:YJEHU eye with abnormal pupil, left eye reactive.  Motor: MAEW Sensory:intact to LT  Pertinent Labs: Cr 0.97  Impression: 61 yo M With breakthrough seizures due to noncompliance(had medication delivery that did not arrive).   Recommendations: 1) Continue home dose keppra 500mg  BID 2) No further recommendations at this time.    Roland Rack, MD [facility] [phone]  If 7pm- 7am, please page neurology on call as listed in [place].

## 2017-02-20 ENCOUNTER — Encounter (HOSPITAL_COMMUNITY): Payer: Self-pay

## 2017-02-20 ENCOUNTER — Emergency Department (HOSPITAL_COMMUNITY)
Admission: EM | Admit: 2017-02-20 | Discharge: 2017-02-22 | Disposition: A | Discharge status: Home / Self Care | Payer: Medicaid Other | Attending: Emergency Medicine | Admitting: Emergency Medicine

## 2017-02-20 DIAGNOSIS — Z79899 Other long term (current) drug therapy: Secondary | ICD-10-CM | POA: Insufficient documentation

## 2017-02-20 DIAGNOSIS — F17210 Nicotine dependence, cigarettes, uncomplicated: Secondary | ICD-10-CM | POA: Diagnosis not present

## 2017-02-20 DIAGNOSIS — F3110 Bipolar disorder, current episode manic without psychotic features, unspecified: Secondary | ICD-10-CM | POA: Diagnosis not present

## 2017-02-20 DIAGNOSIS — R569 Unspecified convulsions: Secondary | ICD-10-CM | POA: Diagnosis present

## 2017-02-20 DIAGNOSIS — F309 Manic episode, unspecified: Secondary | ICD-10-CM

## 2017-02-20 DIAGNOSIS — F4325 Adjustment disorder with mixed disturbance of emotions and conduct: Secondary | ICD-10-CM

## 2017-02-20 LAB — CBC WITH DIFFERENTIAL/PLATELET
Basophils Absolute: 0 10*3/uL (ref 0.0–0.1)
Basophils Relative: 0 %
EOS ABS: 0.2 10*3/uL (ref 0.0–0.7)
EOS PCT: 4 %
HCT: 37.6 % — ABNORMAL LOW (ref 39.0–52.0)
Hemoglobin: 12.3 g/dL — ABNORMAL LOW (ref 13.0–17.0)
LYMPHS ABS: 1.9 10*3/uL (ref 0.7–4.0)
LYMPHS PCT: 32 %
MCH: 28.3 pg (ref 26.0–34.0)
MCHC: 32.7 g/dL (ref 30.0–36.0)
MCV: 86.4 fL (ref 78.0–100.0)
MONO ABS: 0.6 10*3/uL (ref 0.1–1.0)
MONOS PCT: 10 %
Neutro Abs: 3.1 10*3/uL (ref 1.7–7.7)
Neutrophils Relative %: 54 %
PLATELETS: 277 10*3/uL (ref 150–400)
RBC: 4.35 MIL/uL (ref 4.22–5.81)
RDW: 14.5 % (ref 11.5–15.5)
WBC: 5.7 10*3/uL (ref 4.0–10.5)

## 2017-02-20 LAB — COMPREHENSIVE METABOLIC PANEL
ALT: 16 U/L — AB (ref 17–63)
ANION GAP: 8 (ref 5–15)
AST: 18 U/L (ref 15–41)
Albumin: 4.2 g/dL (ref 3.5–5.0)
Alkaline Phosphatase: 91 U/L (ref 38–126)
BUN: 13 mg/dL (ref 6–20)
CHLORIDE: 106 mmol/L (ref 101–111)
CO2: 28 mmol/L (ref 22–32)
CREATININE: 1.02 mg/dL (ref 0.61–1.24)
Calcium: 9.1 mg/dL (ref 8.9–10.3)
Glucose, Bld: 90 mg/dL (ref 65–99)
POTASSIUM: 3.9 mmol/L (ref 3.5–5.1)
Sodium: 142 mmol/L (ref 135–145)
Total Bilirubin: 0.8 mg/dL (ref 0.3–1.2)
Total Protein: 6.8 g/dL (ref 6.5–8.1)

## 2017-02-20 LAB — CK: CK TOTAL: 207 U/L (ref 49–397)

## 2017-02-20 LAB — RAPID URINE DRUG SCREEN, HOSP PERFORMED
Amphetamines: NOT DETECTED
BENZODIAZEPINES: NOT DETECTED
Barbiturates: NOT DETECTED
Cocaine: NOT DETECTED
OPIATES: NOT DETECTED
Tetrahydrocannabinol: NOT DETECTED

## 2017-02-20 LAB — ETHANOL

## 2017-02-20 LAB — CBG MONITORING, ED: Glucose-Capillary: 127 mg/dL — ABNORMAL HIGH (ref 65–99)

## 2017-02-20 MED ORDER — TIMOLOL MALEATE 0.5 % OP SOLN
1.0000 [drp] | Freq: Two times a day (BID) | OPHTHALMIC | Status: DC
  Administered 2017-02-20 – 2017-02-21 (×3): 1 [drp] via OPHTHALMIC
  Filled 2017-02-20: qty 5

## 2017-02-20 MED ORDER — LORAZEPAM 1 MG PO TABS
1.0000 mg | ORAL_TABLET | ORAL | Status: AC | PRN
  Administered 2017-02-20: 1 mg via ORAL
  Filled 2017-02-20: qty 1

## 2017-02-20 MED ORDER — ATROPINE SULFATE 1 % OP SOLN
1.0000 [drp] | Freq: Three times a day (TID) | OPHTHALMIC | Status: DC
  Administered 2017-02-20 – 2017-02-22 (×5): 1 [drp] via OPHTHALMIC
  Filled 2017-02-20 (×3): qty 2

## 2017-02-20 MED ORDER — ZIPRASIDONE MESYLATE 20 MG IM SOLR
20.0000 mg | INTRAMUSCULAR | Status: AC | PRN
  Administered 2017-02-21: 20 mg via INTRAMUSCULAR
  Filled 2017-02-20 (×2): qty 20

## 2017-02-20 MED ORDER — STERILE WATER FOR INJECTION IJ SOLN
INTRAMUSCULAR | Status: AC
  Filled 2017-02-20: qty 10

## 2017-02-20 MED ORDER — OLANZAPINE 5 MG PO TBDP
5.0000 mg | ORAL_TABLET | Freq: Three times a day (TID) | ORAL | Status: DC | PRN
  Administered 2017-02-20: 5 mg via ORAL
  Filled 2017-02-20 (×3): qty 1

## 2017-02-20 MED ORDER — LEVETIRACETAM 500 MG PO TABS: 500.0000 mg | ORAL_TABLET | Freq: Once | ORAL | Status: DC

## 2017-02-20 MED ORDER — LEVETIRACETAM 500 MG PO TABS
500.0000 mg | ORAL_TABLET | Freq: Two times a day (BID) | ORAL | Status: DC
  Administered 2017-02-20 – 2017-02-22 (×4): 500 mg via ORAL
  Filled 2017-02-20 (×4): qty 1

## 2017-02-20 MED ORDER — BRIMONIDINE TARTRATE 0.2 % OP SOLN
1.0000 [drp] | Freq: Three times a day (TID) | OPHTHALMIC | Status: DC
  Administered 2017-02-20 – 2017-02-21 (×4): 1 [drp] via OPHTHALMIC
  Filled 2017-02-20: qty 5

## 2017-02-20 MED ORDER — DORZOLAMIDE HCL 2 % OP SOLN
1.0000 [drp] | Freq: Three times a day (TID) | OPHTHALMIC | Status: DC
  Administered 2017-02-20 – 2017-02-22 (×5): 1 [drp] via OPHTHALMIC
  Filled 2017-02-20 (×3): qty 10

## 2017-02-20 MED ORDER — LEVETIRACETAM 500 MG PO TABS
1000.0000 mg | ORAL_TABLET | Freq: Once | ORAL | Status: AC
  Administered 2017-02-20: 1000 mg via ORAL
  Filled 2017-02-20: qty 2

## 2017-02-20 NOTE — ED Notes (Signed)
TTS performing evaluation at this time

## 2017-02-20 NOTE — ED Notes (Signed)
This RN discussed POC with [name], pt's POA

## 2017-02-20 NOTE — ED Provider Notes (Signed)
[facility] DEPT Provider Note   CSN: [id number] Arrival date & time: 02/20/17  0254     History   Chief Complaint No chief complaint on file.   HPI Brent Ramos is a 61 y.o. male who presents with an unwittnessed seizure. PMH significant for seizures, alcohol abuse, glaucoma, non-adherence to medical therapy. He states he was sitting outside last night and had "5 seizures". He is a poor historian and cannot describe the details around seizure activity. EMS was somehow called and he was transported to the ED. He denies any alcohol use since he was last discharged from the hospital on 9/24. He reports compliance with Keppra. Denies any other drug use. Denies recent fever or illness, chest pain, SOB, abdominal pain, N/V. He reports soreness in his back from possible fall from seizing.   I spoke with [name] the patient's POA who states that he was sent here for a mental health eval. He has had manic behavior - not sleeping for 6 days and has been fighting with him and then getting upset and passing out. The patient states that this is true but [name] is the "devil" and can't tell him what to do because he's a grown man. The patient denies SI, HI, AVH. He denies that he has been harmed by anyone as well.  HPI  Past Medical History:  Diagnosis Date  . Alcohol abuse   . Seizures ([place])    "has had them for years" (02/05/2017)    Patient Active Problem List   Diagnosis Date Noted  . Seizure disorder ([place]) 02/05/2017  . Rhabdomyolysis 02/05/2017  . Alcohol abuse 02/05/2017  . Nonadherence to medication 02/05/2017  . Dehydration 02/05/2017  . Recurrent seizures ([place]) 02/05/2017  . Glaucoma 02/05/2017  . Abnormal EKG 02/05/2017    Past Surgical History:  Procedure Laterality Date  . NEPHRECTOMY LIVING DONOR  1970s       Home Medications    Prior to Admission medications   Medication Sig Start Date End Date Taking? Authorizing Provider  brimonidine (ALPHAGAN) 0.2 %  ophthalmic solution Place 1 drop into both eyes 3 (three) times daily.    [provider]  dorzolamide (TRUSOPT) 2 % ophthalmic solution Place 1 drop into both eyes 3 (three) times daily.    [provider]  levETIRAcetam (KEPPRA) 500 MG tablet Take 1 tablet (500 mg total) by mouth 2 (two) times daily. 02/01/17   Kinnie Feil, PA-C  timolol (TIMOPTIC) 0.5 % ophthalmic solution Place 1 drop into both eyes 2 (two) times daily.    [provider]    Family History No family history on file.  Social History Social History  Substance Use Topics  . Smoking status: Current Every Day Smoker    Packs/day: 0.50    Years: 45.00    Types: Cigarettes  . Smokeless tobacco: Former Systems developer    Types: Chew  . Alcohol use Yes     Comment: 02/05/2017 "nothing in the last month; h/o abuse"     Allergies   Patient has no known allergies.   Review of Systems Review of Systems  Constitutional: Negative for fever.  Eyes: Positive for visual disturbance (has glaucoma).  Respiratory: Negative for shortness of breath.   Cardiovascular: Negative for chest pain.  Gastrointestinal: Negative for abdominal pain, nausea and vomiting.  Musculoskeletal: Positive for back pain.  Neurological: Positive for seizures and syncope. Negative for weakness, numbness and headaches.  Psychiatric/Behavioral: Positive for agitation, behavioral problems and sleep disturbance.  Negative for confusion, decreased concentration, dysphoric mood, hallucinations, self-injury and suicidal ideas. The patient is hyperactive.   All other systems reviewed and are negative.    Physical Exam Updated Vital Signs BP (!) 152/104   Pulse 67   Temp 97.7 F (36.5 C) (Oral)   Resp 13   SpO2 100%   Physical Exam  Constitutional: He is oriented to person, place, and time. He appears well-developed and well-nourished. No distress.  Chronically ill appearing  HENT:  Head: Normocephalic and atraumatic.  Eyes:  Right eye exhibits discharge. Left eye exhibits no discharge. No scleral icterus.  Right eye is cloudy Left pupil is extremely dilated  Neck: Normal range of motion.  Cardiovascular: Normal rate and regular rhythm.  Exam reveals no gallop and no friction rub.   No murmur heard. Pulmonary/Chest: Effort normal and breath sounds normal. No respiratory distress. He has no wheezes. He has no rales. He exhibits no tenderness.  Abdominal: Soft. Bowel sounds are normal. He exhibits no distension and no mass. There is no tenderness. There is no rebound and no guarding. No hernia.  Neurological: He is alert and oriented to person, place, and time.  Skin: Skin is warm and dry.  Psychiatric: He has a normal mood and affect. His speech is normal and behavior is normal. He is not actively hallucinating. He expresses no homicidal and no suicidal ideation. He expresses no homicidal plans.  Nursing note and vitals reviewed.    ED Treatments / Results  Labs (all labs ordered are listed, but only abnormal results are displayed) Labs Reviewed  CBC WITH DIFFERENTIAL/PLATELET - Abnormal; Notable for the following:       Result Value   Hemoglobin 12.3 (*)    HCT 37.6 (*)    All other components within normal limits  COMPREHENSIVE METABOLIC PANEL - Abnormal; Notable for the following:    ALT 16 (*)    All other components within normal limits  CBG MONITORING, ED - Abnormal; Notable for the following:    Glucose-Capillary 127 (*)    All other components within normal limits  ETHANOL  CK  RAPID URINE DRUG SCREEN, HOSP PERFORMED    EKG  EKG Interpretation None       Radiology No results found.  Procedures Procedures (including critical care time)  Medications Ordered in ED Medications  levETIRAcetam (KEPPRA) tablet 1,000 mg (1,000 mg Oral Given 02/20/17 0924)     Initial Impression / Assessment and Plan / ED Course  I have reviewed the triage vital signs and the nursing notes.  Pertinent  labs & imaging results that were available during my care of the patient were reviewed by me and considered in my medical decision making (see chart for details).  61 year old male presents with possible seizure and for mental health eval. Vitals are normal. Exam is unremarkable. Labs are overall improved since his last admission. Pt medically cleared for TTS consult.  TTS recommends Geri-psych placement. Pt is unwilling to go. IVC paperwork was filled out. Pt was given food. He has had no seizure activity in the ED.   Final Clinical Impressions(s) / ED Diagnoses   Final diagnoses:  Seizures ([place])  [facility])    New Prescriptions New Prescriptions   No medications on file     Iris Pert 02/20/17 [place], MD 02/20/17 1535

## 2017-02-20 NOTE — BH Assessment (Signed)
Tele Assessment Note   Patient Name: Brent Ramos MRN: [id number] Referring Physician: Maryan Ramos Location of Patient: [facility] Location of Provider: [place] Ramos is an 61 y.o. male who was brought in by EMS after having an unwitnessed seizure. Pt is a poor historian and can not remember what happened prior to his arrival but states that he believes he had several seizures. 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 Pt has a POA Brent Ramos [phone]) who helps care for him. He told the MD that the pt needs a mental health evaluation because he has not been sleeping x6 days and has been exhibiting "manic" behavior, religiously preoccupied, fighting with him and agitated easily. Pt was in and out of coherence and could not remember his birthday. He does state that he hasn't been able to sleep and he "can't stop cleaning". He states "I don't see what's wrong with wanting a clean house". Pt endorses delusions and paranoid that someone is going to harm him and he is afraid to go into his house. He states that "the devil talks to him and tells him he is going to die" which frightens him. He states that he knows that he is "alright with God so he tries not to listen". He states that he has been getting more agitated lately and has had thoughts of hurting himself or someone else when he is around "loud noises or people talking a lot". Pt has a history of long term alcohol addiction starting around the time he was 61 years old. He states he has not used since his previous discharge on September 24th and his alcohol level is 0. UDS has not resulted but he denies drug use. There is no history of previous mental health diagnoses in his chart and pt could not tell me any psychiatric history he has. Attempted to call POA but had to leave HIPPA compliant message.   Per Brent Boers NP pt meets inpatient criteria.    Diagnosis: R/O Bipolar 1 disorder current episode manic, Alcohol use disorder severe   Past Medical History:  Past Medical History:  Diagnosis Date  . Alcohol abuse   . Seizures ([place])    "has had them for years" (02/05/2017)    Past Surgical History:  Procedure Laterality Date  . NEPHRECTOMY LIVING DONOR  1970s    Family History: No family history on file.  Social History:  reports that he has been smoking Cigarettes.  He has a 22.50 pack-year smoking history. He has quit using smokeless tobacco. His smokeless tobacco use included Chew. He reports that he drinks alcohol. He reports that he does not use drugs.  Additional Social History:  Alcohol / Drug Use History of alcohol / drug use?: Yes Substance #1 Name of Substance 1: Alcohol  1 - Age of First Use: 21  1 - Amount (size/oz): 18-36 beers 1 - Frequency: daily  1 - Duration: 40 years 1 - Last Use / Amount: unknown- pt poor historian  CIWA: CIWA-Ar BP: (!) 121/92 Pulse Rate: 67 COWS:    PATIENT STRENGTHS: (choose at least two) Average or above average intelligence Supportive family/friends  Allergies: No Known Allergies  Home Medications:  (Not in a hospital admission)  OB/GYN Status:  No LMP for male patient.  General Assessment Data Location of Assessment: [facility] ED TTS Assessment: In system Is this a Tele or Face-to-Face Assessment?: Tele  Assessment Is this an Initial Assessment or a Re-assessment for this encounter?: Initial Assessment Marital status: Single Is patient pregnant?: No Pregnancy Status: No Living Arrangements: Other (Comment) (POA- Brent Ramos) Can pt return to current living arrangement?: Yes Admission Status: Voluntary Is patient capable of signing voluntary admission?: No Referral Source: Self/Family/Friend Insurance type: Medicaid     Crisis Care Plan Living Arrangements: Other (Comment) (POA- Brent Ramos) Name of Psychiatrist: None Name of Therapist: None  Education  Status Is patient currently in school?: No  Risk to self with the past 6 months Suicidal Ideation: Yes-Currently Present ("sometimes wants to hurt himself") Has patient been a risk to self within the past 6 months prior to admission? : No Suicidal Intent: No Has patient had any suicidal intent within the past 6 months prior to admission? : No Is patient at risk for suicide?: No Suicidal Plan?: No Has patient had any suicidal plan within the past 6 months prior to admission? : No Access to Means: No What has been your use of drugs/alcohol within the last 12 months?: long history of alcoholism Previous Attempts/Gestures: No How many times?: 0 Triggers for Past Attempts: None known Intentional Self Injurious Behavior: None Family Suicide History: Unable to assess Recent stressful life event(s): Recent negative physical changes Persecutory voices/beliefs?: Yes Depression: Yes Depression Symptoms: Despondent, Feeling angry/irritable, Insomnia Substance abuse history and/or treatment for substance abuse?: Yes Suicide prevention information given to non-admitted patients: Not applicable  Risk to Others within the past 6 months Homicidal Ideation: Yes-Currently Present (sometimes has thoughts to hurt others- no intent) Does patient have any lifetime risk of violence toward others beyond the six months prior to admission? : Unknown Thoughts of Harm to Others: No Current Homicidal Intent: No Current Homicidal Plan: No Access to Homicidal Means: No Identified Victim: "when people are too loud" History of harm to others?:  (unknown) Assessment of Violence: On admission Violent Behavior Description: yelling and angry with staff in ED Does patient have access to weapons?: No Criminal Charges Pending?: No Does patient have a court date: No Is patient on probation?: No  Psychosis Hallucinations: Auditory Delusions: Persecutory  Mental Status Report Appearance/Hygiene: Bizarre Eye  Contact: Poor Motor Activity: Freedom of movement Speech: Pressured Level of Consciousness: Alert Mood: Anxious Affect: Depressed Anxiety Level: Moderate Judgement: Impaired Orientation: Place, Person Obsessive Compulsive Thoughts/Behaviors: Moderate  Cognitive Functioning Memory: Recent Impaired, Remote Impaired IQ: Average Insight: Poor Impulse Control: Poor Appetite: Fair Weight Loss: 0 Weight Gain: 0 Sleep: Decreased Total Hours of Sleep:  (states he hasn't slept in 6 days) Vegetative Symptoms: None  ADLScreening [facility] Assessment Services) Patient's cognitive ability adequate to safely complete daily activities?: Yes Patient able to express need for assistance with ADLs?: Yes Independently performs ADLs?: No  Prior Inpatient Therapy Prior Inpatient Therapy:  (unknown)  Prior Outpatient Therapy Prior Outpatient Therapy:  (unknown) Does patient have an ACCT team?: No Does patient have Intensive In-House Services?  : No Does patient have [facility] services? : No Does patient have P4CC services?: No  ADL Screening (condition at time of admission) Patient's cognitive ability adequate to safely complete daily activities?: Yes Is the patient deaf or have difficulty hearing?: No Does the patient have difficulty seeing, even when wearing glasses/contacts?: Yes Does the patient have difficulty concentrating, remembering, or making decisions?: Yes Patient able to express need for assistance with ADLs?: Yes Does the patient have difficulty dressing or bathing?: Yes Independently performs ADLs?: No Communication: Independent Dressing (OT): Needs assistance Is this a  change from baseline?: Pre-admission baseline Grooming: Independent Feeding: Independent Bathing: Needs assistance Is this a change from baseline?: Pre-admission baseline Toileting: Independent In/Out Bed: Needs assistance Is this a change from baseline?: Pre-admission baseline Walks in Home: Needs  assistance Is this a change from baseline?: Pre-admission baseline Does the patient have difficulty walking or climbing stairs?: Yes Weakness of Legs: Both Weakness of Arms/Hands: Both  Home Assistive Devices/Equipment Home Assistive Devices/Equipment: Grab bars in shower  Therapy Consults (therapy consults require a physician order) PT Evaluation Needed: No OT Evalulation Needed: No SLP Evaluation Needed: No Abuse/Neglect Assessment (Assessment to be complete while patient is alone) Physical Abuse: Denies Verbal Abuse: Denies Exploitation of patient/patient's resources: Denies Self-Neglect: Denies Values / Beliefs Cultural Requests During Hospitalization: None Spiritual Requests During Hospitalization: Hospital staff spiritual visit, Prayer Consults Spiritual Care Consult Needed: No Social Work Consult Needed: No Regulatory affairs officer (For Healthcare) Does Patient Have a Medical Advance Directive?: Yes Type of Advance Directive: Healthcare Power of [place] in Chart?: Yes Would patient like information on creating a medical advance directive?: No - Patient declined Nutrition Screen- [facility] Adult/WL/AP Patient's home diet: Regular Has the patient recently lost weight without trying?: No Has the patient been eating poorly because of a decreased appetite?: No Malnutrition Screening Tool Score: 0  Additional Information 1:1 In Past 12 Months?: No CIRT Risk: No Elopement Risk: No Does patient have medical clearance?: Yes     Disposition:  Disposition Initial Assessment Completed for this Encounter: Yes Disposition of Patient: Inpatient treatment program Type of inpatient treatment program: Adult  This service was provided via telemedicine using a 2-way, interactive audio and Radiographer, therapeutic.  Names of all persons participating in this telemedicine service and their role in this encounter. Name: [facility] [place], [place] Role: Therapist    Name: Cristina Scheffler  Role: [facility], [place]  02/20/2017 11:34 AM

## 2017-02-20 NOTE — ED Notes (Signed)
[name] (Friend) called and stated that he is the pt's POA, papers were filled out here. Friend of the pt states that once the pt is discharged, he needs to be called, his phone number is in the chart. This friend also stated a previous time that the pt was discharged and he was not notified, hospital staff gave the pt a bus ticket and got lost.

## 2017-02-20 NOTE — ED Notes (Signed)
IVC papers served - Copy faxed to [facility], copy sent to Medical Records, 3 sets on clipboard, original placed in folder for [employer].

## 2017-02-20 NOTE — ED Notes (Signed)
This RN explained to pt and friend the rules of holding as a psych pt and thoroughly went over all paperwork with both pt and friend.

## 2017-02-20 NOTE — ED Notes (Addendum)
Pt signed consent release for [name] [facility]) to receive info. Copy faxed to [facility], copy sent to medical records, and original placed on clipboard. Pt's friend, [name], gave RN copy of POA paperwork - faxed copy to [facility], copy sent to medical records, and copy placed on clipboard. Pt had signed Medical Clearance Pt Policy form - copy placed on pt's bedside table. Pt noted to be wearing glasses, jean shorts, and tennis shoes. Pt removed jean shorts and shoes as requested - given to Kerry Kass took ALL of pt's belongings. Fall risk bracelet applied to pt. Fall Risk sign placed on door edge.

## 2017-02-20 NOTE — Discharge Instructions (Signed)
Please take your night dose of Keppra Follow up with Dr. Jonelle Sidle Return for worsening symptoms

## 2017-02-20 NOTE — Progress Notes (Signed)
Patient meets criteria for inpatient treatment. CSW faxed referrals to the following inpatient facilities for review:  [facility], [facility], [place], [place], [place]   TTS will continue to seek bed placement.   Radonna Ricker MSW, [place] Disposition [phone]

## 2017-02-20 NOTE — ED Notes (Signed)
After pt finished phone call, Pt attempted to leave out of Pod F. Sitters and RN encouraged pt to return to room. Pt refused - Security assisted w/escorting pt back to room. Pt noted to be calm, cooperative. States wanted to go outside to smoke a cigarette. Offered Nicotine Patch - refused.

## 2017-02-20 NOTE — ED Triage Notes (Signed)
Pt states that he had 5 seizures today, unwitnessed. AxO x 4, no incontinence

## 2017-02-20 NOTE — ED Notes (Signed)
Pt attempted to leave ED - walked out into hallways in ED - escorted back to room by staff and security. Dr Alvino Chapel aware - orders received. Pt in bathroom having BM - security standing by.

## 2017-02-20 NOTE — ED Notes (Signed)
Brent Ramos, [place], recommending to IVC pt d/t pt refusing to come to [facility] F. Advised pt's friend may call her and she can discuss process w/him. Dr Lafayette Dragon aware.

## 2017-02-20 NOTE — ED Notes (Signed)
Pt noted w/excessive talking to [employer].

## 2017-02-20 NOTE — ED Notes (Addendum)
Pt roomed to room b-14 at this time.  Pt is lethargic and cannot state his birthday. 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 Blood is drawn at this time for analysis and sent to lab via another EMT. During the blood draw, pt becomes verbally aggressive with the tech and states "I should just leave this place, this is bullshit." Pt is reassured by tech and this RN that we are trying to help him.  As this RN attempts to obtain the patients medical history he states "shut the fuck up woman, you are questioning me to God damn much."  "I already told you people, you have the papers in front of you, why don't you read them."

## 2017-02-20 NOTE — ED Notes (Signed)
Pt returned to room - security assisting. Pt noted to continue to be irritable and agitated. States he is not staying. Pt noted to be cursing at staff. Pt initially refusing Zyprexa - Geodon inj prepared. Pt refusing injection. Pt standing in room - refused to sit down. Pt finally sat on bed - continued to refuse injection - states will take pill. Zyprexa given.

## 2017-02-20 NOTE — ED Notes (Signed)
Patient asking if we have any Bengay or something for pain.  Sts he has pain in his muscles from his seizures.  This RN offered the already-ordered ativan, explaining the anxiolytic properties would help his muscles relax.  Also informed pt that he didn't have anything for pain at this time and we may or may not have Bengay.  He agreed to try the ativan.  This RN also had to explain to patient that I could not give him his seizure medications this early due to our medication scheduling system.  Pt agreeable to this plan.

## 2017-02-20 NOTE — ED Notes (Signed)
This RN spoke with Erasmo Downer with TTS at this time

## 2017-02-20 NOTE — ED Notes (Signed)
Pt on phone at nurses' desk. 

## 2017-02-20 NOTE — ED Notes (Signed)
Pt sitting on bed watching tv. Pt apologized to security and staff.

## 2017-02-20 NOTE — ED Notes (Signed)
Upon first assessment, patient denies history of seizures, and reports "I woke up on the floor of someone's house."  This RN asked pt, "what makes you think you had a seizure today?" Pt states "I already told you, I woke up on the floor."  This RN asked patient what medicines he takes and he reported "I take dilantin and I haven't taken it today."  This RN reassessed pt's seizure history, and pt blurts out "that is what's wrong with you white people, you ask too many God damned questions."

## 2017-02-20 NOTE — ED Notes (Signed)
Pt's friend Janice Norrie) took all of pt;s belongings home including medications.

## 2017-02-21 MED ORDER — STERILE WATER FOR INJECTION IJ SOLN
INTRAMUSCULAR | Status: AC
  Filled 2017-02-21: qty 10

## 2017-02-21 MED ORDER — LORAZEPAM 2 MG/ML IJ SOLN: 2.0000 mg | Freq: Once | INTRAMUSCULAR | Status: DC

## 2017-02-21 NOTE — ED Notes (Signed)
Pt attempted to use phone against the instruction from this RN. Pt took phone from desktop and refused to return phone to desk. RN removed phone from pt's hands. Pt became agitated and began making aggressive comments towards staff. Security placed on standby in pod.

## 2017-02-21 NOTE — ED Notes (Signed)
Pt states "im feeling much better today, that medicine is really helping me."

## 2017-02-21 NOTE — ED Notes (Signed)
Meal delivered

## 2017-02-21 NOTE — Progress Notes (Signed)
Patient meets criteria for inpatient treatment. CSW faxed referrals to the following inpatient facilities for review (02/20/17):  [address] [facility], [place], [place], [place], [place], [place]  In addition to those facilities, the patient's information was also sent to the following facilities for review (02/21/17):   [facility], [facility], [facility], [facility], [place], [place], [place], [place]  Per Park Breed, RN, the patient can complete his daily ADLS independently and could program on an adult psychiatric unit.   TTS will continue to seek bed placement.   Radonna Ricker MSW, [place] Disposition [phone]

## 2017-02-21 NOTE — ED Notes (Addendum)
Pt attempted to leave POD F but was stopped by sitters prior to making out of the doors. This RN entered POD after EDP consultation and attempted to verbally deescalate situation and encourage pt to return to his room. Pt was verbally abusive and aggressive body language while continuing to force his way past staff to exit POD. Physical restraint was implemented for pt safety. Pt was escorted back to room by RN with security assistance. Pt was placed on bed by security ahead of emergent medication administration by this RN.

## 2017-02-21 NOTE — ED Notes (Signed)
Pt spoke with [name] on the phone once again. Pt under the impression that he is leaving the ED today but this RN informed him that further evaluation will needed to be completed prior to him being permitted to leave. Pt became frustrated but remained compliant. Pt returned to his room without incident.

## 2017-02-21 NOTE — BHH Counselor (Signed)
Pt denies current SI/HI. Pt stated "I felt it yesterday." When asked about AVH. Pt states "I'm okay today."  TTS will continue to seek placement.   Lorenza Cambridge, [facility] Triage Specialist

## 2017-02-21 NOTE — ED Provider Notes (Signed)
Called to bedside for agitation.  Pt received IM geodon 10 minutes earlier for agitation, cursing and threatening staff.  He is still agitated with swearing but not violent.  Will provide additional po ativan with IM available prn agitation.     Quintella Reichert, MD 02/21/17 1946

## 2017-02-21 NOTE — ED Notes (Signed)
Pt became irate when this RN refused access to phone. Pt attempted to leave pod, this RN secured pt and physically returned with pt to room. Security notified and are now at bedside.

## 2017-02-21 NOTE — ED Notes (Addendum)
Pt ambulated to restroom without complication. Pleasant mood at this time.

## 2017-02-21 NOTE — ED Notes (Addendum)
Pt placing phone call with sitter assistance. Pt had 5 minute phone call, speaking with "[name]". Conversation was calm but pt told [name] not to answer his door as "it could be the devil knocking".

## 2017-02-22 ENCOUNTER — Encounter (HOSPITAL_COMMUNITY): Payer: Self-pay | Admitting: Registered Nurse

## 2017-02-22 DIAGNOSIS — F4325 Adjustment disorder with mixed disturbance of emotions and conduct: Secondary | ICD-10-CM

## 2017-02-22 LAB — CBG MONITORING, ED: GLUCOSE-CAPILLARY: 108 mg/dL — AB (ref 65–99)

## 2017-02-22 MED ORDER — ZIPRASIDONE MESYLATE 20 MG IM SOLR
20.0000 mg | Freq: Once | INTRAMUSCULAR | Status: DC
  Filled 2017-02-22: qty 20

## 2017-02-22 MED ORDER — STERILE WATER FOR INJECTION IJ SOLN
INTRAMUSCULAR | Status: AC
  Filled 2017-02-22: qty 10

## 2017-02-22 MED ORDER — ZOLPIDEM TARTRATE 5 MG PO TABS: 5.0000 mg | ORAL_TABLET | Freq: Every evening | ORAL | 0 refills | Status: DC | PRN

## 2017-02-22 NOTE — ED Notes (Signed)
Pt POA present at bedside for visiting hours. Both him and the patient are upset that he is still here and would like for him to go home.  POA states that he is better and sleeping and would really like to take him home. EDP at bedside to update patient and POA. This RN called [facility] and they will reassess him this afternoon to see if he now meets criteria to go home.  Pt and POA both are appreciative and agree to wait for reassessment to decide on patients plan of care.

## 2017-02-22 NOTE — ED Notes (Signed)
Pt became very agitated stating that he was upset for being moved into the hallway, states that nurse last night did not give him his medications, pt is requesting dilantin although not prescribed, and pt wanting to make a phone call. Security and [employer] at bedside, pt agrees to calm down if he is allowed to make a phone call. Pt made one phone call. Cooperating at this time.

## 2017-02-22 NOTE — ED Notes (Signed)
Regular Diet was ordered for Lunch. 

## 2017-02-22 NOTE — ED Notes (Signed)
Pt verbalized understanding discharge instructions and denies any further needs or questions at this time. VS stable, ambulatory and steady gait.   

## 2017-02-22 NOTE — ED Notes (Signed)
Pt is ready for discharge.  RN waiting on POA [name] to release Pt to. Attempted to call [name] multiple times with no success.

## 2017-02-22 NOTE — ED Notes (Addendum)
Family requested this RN to look into when his Keppra was last given, upon this RN looking up the information family called for this RN to come to bedside at 0850 stating that the patient was having a seizure. On this RN arrival to bedside patient is having seizure like activity, not responding to stimuli, breathing well on his own. Chrislyn RN obtained vital signs WNL and Dr. Wilson Singer brought to bedside. Pt is now post ictal not responding to stimuli. Family upset that this RN had not brought him the medication list, I instructed to family that "I needed to make sure the pt was okay from his seizure then I would be happy to continue working on his medication list". Family very upset.  This RN attempted to place an IV for medication and the patient instantly opened his eyes and said he "was fine and not taking any needles".  Corene Cornea RN at bedside speaking to family. Pt is now alert and oriented with no complaints. Vs remain wnl.

## 2017-02-22 NOTE — ED Provider Notes (Signed)
Pt's POA, [name] is at bedside. I have had two separate conversations with him at this point. Explained pt is currently IVC'd and recommendation for inpt treatment. I am not willing to rescind IVC without another TTS assessment.   He explained his initial concerns about manic behavior were primarily because patient wasn't sleeping. He feels like he is much closer to his baseline since he has had some rest and feels comfortable taking him home. I explained that they may potentially recommend discharge with this new information but he still needs another assessment.   Pt subsequently had an episode of seizure like activity. I evaluated him within a couple minutes of this and question wether it was a true seizure. Regardless, two nurses were at bedside with me. CBG checked and vitals reassessed. POA with multiple requests such as medications he has been getting, the times he has been getting them, etc to the point where I felt they were excessive. I tried to reassure him that we were trying our best to act in what we felt were pt's best interests. Plan for repeat eval later today.    Virgel Manifold, MD 02/27/17 [phone]

## 2017-02-22 NOTE — ED Provider Notes (Signed)
Psychiatry has reevaluated patient and no longer meets IVC criteria. Patient is hoping to go home. He is alert, awake, and in no acute distress. Does not appear psychotic and has no suicidal or homicidal thoughts. Discharge home with outpatient follow-up.   Sherwood Gambler, MD 02/22/17 1630

## 2017-02-22 NOTE — ED Notes (Signed)
Regular Diet was ordered for Dinner. 

## 2017-02-22 NOTE — ED Notes (Signed)
Family member provided with patients medication list and given a pamphlet on visiting hours for [facility] patients. Family would like to be notified after the patient is reassessed and can be reached at [phone]

## 2017-02-22 NOTE — Progress Notes (Signed)
Per Earleen Newport, NP, the patient does not meet criteria for inpatient treatment. The patient is recommended for discharge back to his Healthcare POA care.   Patient's Healthcare POA is  [name] [phone]).  CSW contacted Mr. Lewis to inform him that the patient was recommended for discharge. Mr Bobby Rumpf stated he will pick the patient up in the next hour.   Cleatis Polka, RN notified.    Radonna Ricker MSW, [place] Disposition [phone]

## 2017-02-22 NOTE — ED Notes (Signed)
Pt has been XXX due to arguments he has with his partner.  Pt and partner have been made aware of change in visitor policy.

## 2017-02-22 NOTE — Consult Note (Signed)
Telepsych Consultation   Reason for Consult:  Insomnia and manic behavior Referring Physician:  Recardo Evangelist, PA-C Location of Patient: [facility] Location of Provider: [facility]  Patient Identification: Brent Ramos MRN:  [id number] Principal Diagnosis: Adjustment disorder with mixed disturbance of emotions and conduct Diagnosis:   Patient Active Problem List   Diagnosis Date Noted  . Adjustment disorder with mixed disturbance of emotions and conduct [F43.25] 02/22/2017  . Seizure disorder ([place]) [H47.425] 02/05/2017  . Rhabdomyolysis [M62.82] 02/05/2017  . Alcohol abuse [F10.10] 02/05/2017  . Nonadherence to medication [Z91.14] 02/05/2017  . Dehydration [E86.0] 02/05/2017  . Recurrent seizures ([place]) [Z56.387] 02/05/2017  . Glaucoma [H40.9] 02/05/2017  . Abnormal EKG [R94.31] 02/05/2017    Total Time spent with patient: 45 minutes  Subjective:   Brent Ramos is a 61 y.o. male patient presented to [facility] via EMS with complaints given by caregiver that patient "needs a mental health evaluation because he has not been sleeping x 6 days and has been exhibiting "manic" behavior, religiously preoccupied, fighting with him and agitated easily.    HPI:  Brent Ramos, 61 y.o., male patient seen via telepsych by this provider on 02/22/17.  Chart reviewed and consulted with Dr. Dwyane Dee.  On evaluation Brent Ramos reports that he was listening to a audio book and it was saying stuff about this lady was going to kill black people and "all kinds of bad stuff.  I started feeling like demons was in the house and had to get rid of the DVD."  Patient states that he is feeling better to day and that his POA Brent Ramos has removed all of the stuff from the house.  At this time patient denies suicidal/homicidal ideation, psychosis, and paranoia.  Denies prior psychiatric history.   During assessment patient calm/cooperative, alert/oriented x 4.  Patient did not appear to  be responding to internal/external stimuli.  Patient had some paranoia tendencies stating that he had his POA also remove some other items from house that was being used by a friend that had been at the house also.  Patients POA and caregiver is also wanting patient to come home and feel that patient is safe at home.  Patient denied suicidal/homicidal ideation, psychosis, and paranoia.    Past Psychiatric History: No prior psychiatric history  Risk to Self: Suicidal Ideation: Yes-Currently Present ("sometimes wants to hurt himself") Suicidal Intent: No Is patient at risk for suicide?: No Suicidal Plan?: No Access to Means: No What has been your use of drugs/alcohol within the last 12 months?: long history of alcoholism How many times?: 0 Triggers for Past Attempts: None known Intentional Self Injurious Behavior: None Risk to Others: Homicidal Ideation: Yes-Currently Present (sometimes has thoughts to hurt others- no intent) Thoughts of Harm to Others: No Current Homicidal Intent: No Current Homicidal Plan: No Access to Homicidal Means: No Identified Victim: "when people are too loud" History of harm to others?:  (unknown) Assessment of Violence: On admission Violent Behavior Description: yelling and angry with staff in ED Does patient have access to weapons?: No Criminal Charges Pending?: No Does patient have a court date: No Prior Inpatient Therapy: Prior Inpatient Therapy:  (unknown) Prior Outpatient Therapy: Prior Outpatient Therapy:  (unknown) Does patient have an ACCT team?: No Does patient have Intensive In-House Services?  : No Does patient have [facility] services? : No Does patient have P4CC services?: No  Past Medical History:  Past Medical History:  Diagnosis Date  . Alcohol  abuse   . Seizures ([place])    "has had them for years" (02/05/2017)    Past Surgical History:  Procedure Laterality Date  . NEPHRECTOMY LIVING DONOR  1970s   Family History: History reviewed. No  pertinent family history. Family Psychiatric  History: Denies  Social History:  History  Alcohol Use  . Yes    Comment: 02/05/2017 "nothing in the last month; h/o abuse"     History  Drug Use No    Social History   Social History  . Marital status: Divorced    Spouse name: N/A  . Number of children: N/A  . Years of education: N/A   Social History Main Topics  . Smoking status: Current Every Day Smoker    Packs/day: 0.50    Years: 45.00    Types: Cigarettes  . Smokeless tobacco: Former Systems developer    Types: Chew  . Alcohol use Yes     Comment: 02/05/2017 "nothing in the last month; h/o abuse"  . Drug use: No  . Sexual activity: No   Other Topics Concern  . None   Social History Narrative  . None   Additional Social History:    Allergies:  No Known Allergies  Labs:  Results for orders placed or performed during the hospital encounter of 02/20/17 (from the past 48 hour(s))  CBG monitoring, ED     Status: Abnormal   Collection Time: 02/22/17  9:00 AM  Result Value Ref Range   Glucose-Capillary 108 (H) 65 - 99 mg/dL    Medications:  Current Facility-Administered Medications  Medication Dose Route Frequency Provider Last Rate Last Dose  . atropine 1 % ophthalmic solution 1 drop  1 drop Right Eye TID Recardo Evangelist, PA-C   1 drop at 02/21/17 2130  . brimonidine (ALPHAGAN) 0.2 % ophthalmic solution 1 drop  1 drop Both Eyes TID Recardo Evangelist, PA-C   1 drop at 02/21/17 2131  . dorzolamide (TRUSOPT) 2 % ophthalmic solution 1 drop  1 drop Both Eyes TID Recardo Evangelist, PA-C   1 drop at 02/21/17 2130  . levETIRAcetam (KEPPRA) tablet 500 mg  500 mg Oral BID Recardo Evangelist, PA-C   500 mg at 02/22/17 7782  . LORazepam (ATIVAN) injection 2 mg  2 mg Intramuscular Once Quintella Reichert, MD      . OLANZapine zydis [facility]) disintegrating tablet 5 mg  5 mg Oral Q8H PRN Davonna Belling, MD   5 mg at 02/20/17 1851  . sterile water (preservative free) injection            . timolol (TIMOPTIC) 0.5 % ophthalmic solution 1 drop  1 drop Both Eyes BID Recardo Evangelist, PA-C   1 drop at 02/21/17 2130  . ziprasidone (GEODON) injection 20 mg  20 mg Intramuscular Once Ward, Delice Bison, DO   Stopped at 02/22/17 4235   Current Outpatient Prescriptions  Medication Sig Dispense Refill  . atropine 1 % ophthalmic solution Place 1 drop into the right eye 3 (three) times daily.    . dorzolamide (TRUSOPT) 2 % ophthalmic solution Place 1 drop into both eyes 3 (three) times daily.    Marland Kitchen levETIRAcetam (KEPPRA) 500 MG tablet Take 1 tablet (500 mg total) by mouth 2 (two) times daily. 15 tablet 0  . brimonidine (ALPHAGAN) 0.2 % ophthalmic solution Place 1 drop into both eyes 3 (three) times daily.    . timolol (TIMOPTIC) 0.5 % ophthalmic solution Place 1 drop into both eyes 2 (two)  times daily.      Musculoskeletal: Strength & Muscle Tone: within normal limits Gait & Station: normal Patient leans: N/A  Psychiatric Specialty Exam: Physical Exam  ROS  Blood pressure (!) 164/90, pulse 79, temperature 97.6 F (36.4 C), temperature source Oral, resp. rate 20, SpO2 100 %.There is no height or weight on file to calculate BMI.  General Appearance: Casual  Eye Contact:  Blind  Speech:  Clear and Coherent and Normal Rate  Volume:  Normal  Mood:  Anxious  Affect:  Appropriate and Congruent  Thought Process:  Coherent and Goal Directed  Orientation:  Full (Time, Place, and Person)  Thought Content:  Denied hallucinations, delusions, and paranoia  Suicidal Thoughts:  No  Homicidal Thoughts:  No  Memory:  Immediate;   Good Recent;   Good Remote;   Good  Judgement:  Fair  Insight:  Present  Psychomotor Activity:  Normal  Concentration:  Concentration: Good and Attention Span: Good  Recall:  Good  Fund of Knowledge:  Fair  Language:  Good  Akathisia:  No  Handed:  Right  AIMS (if indicated):     Assets:  Communication Skills Desire for Improvement Housing Social  Support Transportation  ADL's:  Intact  Cognition:  WNL  Sleep:        Treatment Plan Summary: Plan Discharge home with outpatient resources  Disposition: No evidence of imminent risk to self or others at present.   Patient does not meet criteria for psychiatric inpatient admission.  This service was provided via telemedicine using a 2-way, interactive audio and video technology.  Names of all persons participating in this telemedicine service and their role in this encounter. Name: Earleen Newport NP Role: Telepsych  Name: Dr Dwyane Dee Role: Psychiatrist  Name: Ludwig Clarks Pieri Role: Patient   Name:  Role:     Earleen Newport, NP 02/22/2017 2:27 PM

## 2017-02-22 NOTE — BH Assessment (Signed)
[facility] Assessment Progress Note   Patient's POA, [name] [phone] called requesting for the patient to be released to him.  He states that the has been in the ED for three days and is no longer a danger to himself or others.  He is requesting a call from the Provider who re-evaluates him this morning.  He states: "I am not approving him to go to a Behavioral Health Unit."  Explained to him that patient was on IVC and that the MD will have the final decision.

## 2017-02-22 NOTE — ED Provider Notes (Signed)
Patient was discharged. After being discharged he request that we give him some sleep meds because he needs some help falling asleep. Patient feels like his mania gets worse when he is unable to sleep.  We explained to him that his mania is probably the reason why he is not sleeping well. I will give him 5 Ambien to go home. Risk of Ambien use discussed.   Varney Biles, MD 02/22/17 [phone]

## 2017-02-22 NOTE — ED Notes (Signed)
Patient was given a coke and a 2nd Financial planner.

## 2017-02-22 NOTE — ED Notes (Signed)
IVC paperwork has been rescinded.  

## 2017-02-22 NOTE — ED Notes (Signed)
Pt eating lunch

## 2017-02-27 ENCOUNTER — Emergency Department (HOSPITAL_COMMUNITY)
Admission: EM | Admit: 2017-02-27 | Discharge: 2017-02-28 | Discharge status: Against Medical Advice | Payer: Medicaid Other | Attending: Emergency Medicine | Admitting: Emergency Medicine

## 2017-02-27 ENCOUNTER — Encounter (HOSPITAL_COMMUNITY): Payer: Self-pay | Admitting: Emergency Medicine

## 2017-02-27 DIAGNOSIS — F17210 Nicotine dependence, cigarettes, uncomplicated: Secondary | ICD-10-CM | POA: Insufficient documentation

## 2017-02-27 DIAGNOSIS — G40909 Epilepsy, unspecified, not intractable, without status epilepticus: Secondary | ICD-10-CM | POA: Insufficient documentation

## 2017-02-27 DIAGNOSIS — Z139 Encounter for screening, unspecified: Secondary | ICD-10-CM

## 2017-02-27 MED ORDER — SODIUM CHLORIDE 0.9 % IV SOLN
1000.0000 mg | Freq: Once | INTRAVENOUS | Status: DC
  Filled 2017-02-27: qty 10

## 2017-02-27 NOTE — ED Notes (Signed)
Bed: WA21 Expected date:  Expected time:  Means of arrival:  Comments: EMS  Seizure like activity  Police custody

## 2017-02-27 NOTE — ED Triage Notes (Signed)
Per EMS, pt. Reported of seizure like activity at around 11pm this evening  witnessed by [employer] which lasted less than a minute. Pt. Is under custody of [employer] for trespassing and assault. Per [employer] , pt. Was kicking inside the [employer] car, resisting arrest  and noted seizure like activity. No report of injury , non postictal . Pt. Uncooperative upon arrival to ED.

## 2017-02-28 ENCOUNTER — Encounter (HOSPITAL_COMMUNITY): Payer: Self-pay | Admitting: Emergency Medicine

## 2017-02-28 NOTE — ED Notes (Signed)
Pt. Uncoopeprative, verbally abusive, agitated, cussing this Nurse and other staff. This Nurse  Attempted to start IV and draw blood , pt. Tried to spit to this Nurse and yelling " get me out in here!" I don't like being in [place], I want to go to [facility] !" pt. Continued kicking the side rails. [employer] at bedside. , Camera operator at bedside. MD at bedside. Pt. Resist treatment and care.

## 2017-02-28 NOTE — ED Provider Notes (Signed)
[place] DEPT Provider Note   CSN: [id number] Arrival date & time: 02/27/17  2340     History   Chief Complaint Chief Complaint  Patient presents with  . seizure like activity    HPI Brent Ramos is a 61 y.o. male.  The history is provided by the patient. No language interpreter was used.  Seizures   This is a recurrent problem. The problem has been resolved. Pertinent negatives include no sleepiness. Characteristics do not include bowel incontinence. The episode was not witnessed. There was no sensation of an aura present. The seizures did not continue in the ED. The seizure(s) had no focality. Possible causes include change in alcohol use. There has been no fever. There were no medications administered prior to arrival.  Patient is refusing all intervention and attempted to spit at staff.  He states he did not want to be brought here and does not want to stay and is cursing at staff.  Police are at the bedside.    Past Medical History:  Diagnosis Date  . Alcohol abuse   . Seizures ([place])    "has had them for years" (02/05/2017)    Patient Active Problem List   Diagnosis Date Noted  . 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 Seizure disorder ([place]) 02/05/2017  . Rhabdomyolysis 02/05/2017  . Alcohol abuse 02/05/2017  . Nonadherence to medication 02/05/2017  . Dehydration 02/05/2017  . Recurrent seizures ([place]) 02/05/2017  . Glaucoma 02/05/2017  . Abnormal EKG 02/05/2017    Past Surgical History:  Procedure Laterality Date  . NEPHRECTOMY LIVING DONOR  1970s       Home Medications    Prior to Admission medications   Medication Sig Start Date End Date Taking? Authorizing Provider  atropine 1 % ophthalmic solution Place 1 drop into the right eye 3 (three) times daily.    [provider]  brimonidine (ALPHAGAN) 0.2 % ophthalmic solution Place 1 drop into both eyes 3 (three) times  daily.    [provider]  dorzolamide (TRUSOPT) 2 % ophthalmic solution Place 1 drop into both eyes 3 (three) times daily.    [provider]  levETIRAcetam (KEPPRA) 500 MG tablet Take 1 tablet (500 mg total) by mouth 2 (two) times daily. 02/01/17   Kinnie Feil, PA-C  timolol (TIMOPTIC) 0.5 % ophthalmic solution Place 1 drop into both eyes 2 (two) times daily.    [provider]  zolpidem (AMBIEN) 5 MG tablet Take 1 tablet (5 mg total) by mouth at bedtime as needed for sleep. 02/22/17   Varney Biles, MD    Family History History reviewed. No pertinent family history.  Social History Social History  Substance Use Topics  . Smoking status: Current Every Day Smoker    Packs/day: 0.50    Years: 45.00    Types: Cigarettes  . Smokeless tobacco: Former Systems developer    Types: Chew  . Alcohol use Yes     Comment: 02/05/2017 "nothing in the last month; h/o abuse"     Allergies   Patient has no known allergies.   Review of Systems Review of Systems  Gastrointestinal: Negative for bowel incontinence.  Neurological: Positive for seizures.  All other systems reviewed and are negative.    Physical Exam Updated Vital Signs BP (!) 161/78 (BP Location: Right Leg)   Pulse 73   Temp 97.6 F (36.4 C) (Oral)   Resp 15   SpO2 93%  Physical Exam  Constitutional: He is oriented to person, place, and time. He appears well-developed and well-nourished. No distress.  HENT:  Head: Normocephalic and atraumatic.  Right Ear: External ear normal.  Left Ear: External ear normal.  Mouth/Throat: Oropharynx is clear and moist.  Eyes: Pupils are equal, round, and reactive to light. Conjunctivae and EOM are normal.  Neck: Normal range of motion. Neck supple. No tracheal deviation present.  Cardiovascular: Normal rate, regular rhythm, normal heart sounds and intact distal pulses.   Pulmonary/Chest: Effort normal and breath sounds normal. No respiratory distress. He has no  wheezes. He has no rales.  Abdominal: Soft. Bowel sounds are normal. He exhibits no mass. There is no tenderness. There is no rebound and no guarding.  Musculoskeletal: Normal range of motion. He exhibits no deformity.  Neurological: He is alert and oriented to person, place, and time. He displays normal reflexes.  Skin: Skin is warm and dry. Capillary refill takes less than 2 seconds.  Psychiatric: His affect is angry.     ED Treatments / Results   Vitals:   02/27/17 2353  BP: (!) 161/78  Pulse: 73  Resp: 15  Temp: 97.6 F (36.4 C)  SpO2: 93%    Labs (all labs ordered are listed, but only abnormal results are displayed) Labs Reviewed  CBC WITH DIFFERENTIAL/PLATELET  I-STAT CHEM 8, ED    Procedures Procedures (including critical care time)  Medications Ordered in ED Medications  levETIRAcetam (KEPPRA) 1,000 mg in sodium chloride 0.9 % 100 mL IVPB (not administered)       Final Clinical Impressions(s) / ED Diagnoses   Final diagnoses:  Encounter for medical screening examination    Patient refused all intervention.  He has been attempting to assault staff.  Police are present and he does not want labs or IV keppra or other intervention.  He is AO3 and has decision making capacity to refuse care.  The risks are but are not limited to: death, coma, stroke, status epilepticus, prolonged morbidity.  He is discharged in police custody.     New Prescriptions New Prescriptions   No medications on file     Junior Kenedy, MD 02/28/17 7517

## 2017-02-28 NOTE — ED Notes (Signed)
Pt. Discharged AMA, under [employer] custody .

## 2017-03-14 ENCOUNTER — Encounter (HOSPITAL_COMMUNITY): Payer: Self-pay | Admitting: Emergency Medicine

## 2017-03-14 ENCOUNTER — Emergency Department (HOSPITAL_COMMUNITY)
Admission: EM | Admit: 2017-03-14 | Discharge: 2017-03-16 | Disposition: A | Discharge status: Home / Self Care | Payer: Medicaid Other | Attending: Emergency Medicine | Admitting: Emergency Medicine

## 2017-03-14 DIAGNOSIS — F4325 Adjustment disorder with mixed disturbance of emotions and conduct: Secondary | ICD-10-CM | POA: Diagnosis not present

## 2017-03-14 DIAGNOSIS — F17210 Nicotine dependence, cigarettes, uncomplicated: Secondary | ICD-10-CM | POA: Insufficient documentation

## 2017-03-14 DIAGNOSIS — F22 Delusional disorders: Secondary | ICD-10-CM | POA: Insufficient documentation

## 2017-03-14 DIAGNOSIS — Z79899 Other long term (current) drug therapy: Secondary | ICD-10-CM | POA: Insufficient documentation

## 2017-03-14 NOTE — ED Notes (Signed)
Bed: PB35 Expected date:  Expected time:  Means of arrival:  Comments: EMS 61 yo male delusional about devil

## 2017-03-14 NOTE — ED Triage Notes (Signed)
Patient in via EMS, found by [employer] wandering. Unable to find home or caregiver. Patient states that he is seeing the devil.

## 2017-03-15 LAB — CBC WITH DIFFERENTIAL/PLATELET
BASOS ABS: 0 10*3/uL (ref 0.0–0.1)
Basophils Relative: 0 %
EOS PCT: 2 %
Eosinophils Absolute: 0.2 10*3/uL (ref 0.0–0.7)
HCT: 32.9 % — ABNORMAL LOW (ref 39.0–52.0)
Hemoglobin: 11.1 g/dL — ABNORMAL LOW (ref 13.0–17.0)
LYMPHS ABS: 1.8 10*3/uL (ref 0.7–4.0)
LYMPHS PCT: 23 %
MCH: 28.8 pg (ref 26.0–34.0)
MCHC: 33.7 g/dL (ref 30.0–36.0)
MCV: 85.2 fL (ref 78.0–100.0)
MONO ABS: 0.7 10*3/uL (ref 0.1–1.0)
Monocytes Relative: 9 %
Neutro Abs: 5.1 10*3/uL (ref 1.7–7.7)
Neutrophils Relative %: 66 %
PLATELETS: 240 10*3/uL (ref 150–400)
RBC: 3.86 MIL/uL — ABNORMAL LOW (ref 4.22–5.81)
RDW: 14.7 % (ref 11.5–15.5)
WBC: 7.8 10*3/uL (ref 4.0–10.5)

## 2017-03-15 LAB — COMPREHENSIVE METABOLIC PANEL
ALK PHOS: 97 U/L (ref 38–126)
ALT: 51 U/L (ref 17–63)
AST: 38 U/L (ref 15–41)
Albumin: 4.2 g/dL (ref 3.5–5.0)
Anion gap: 8 (ref 5–15)
BUN: 15 mg/dL (ref 6–20)
CALCIUM: 8.9 mg/dL (ref 8.9–10.3)
CHLORIDE: 107 mmol/L (ref 101–111)
CO2: 28 mmol/L (ref 22–32)
CREATININE: 1.12 mg/dL (ref 0.61–1.24)
GFR calc Af Amer: >60 mL/min (ref >60)
Glucose, Bld: 99 mg/dL (ref 65–99)
Potassium: 4.4 mmol/L (ref 3.5–5.1)
Sodium: 143 mmol/L (ref 135–145)
Total Bilirubin: 0.9 mg/dL (ref 0.3–1.2)
Total Protein: 7 g/dL (ref 6.5–8.1)

## 2017-03-15 LAB — ACETAMINOPHEN LEVEL: Acetaminophen (Tylenol), Serum: <10 ug/mL — ABNORMAL LOW (ref 10–30)

## 2017-03-15 LAB — ETHANOL

## 2017-03-15 LAB — SALICYLATE LEVEL: Salicylate Lvl: <7 mg/dL (ref 2.8–30.0)

## 2017-03-15 MED ORDER — OLANZAPINE 5 MG PO TBDP
5.0000 mg | ORAL_TABLET | Freq: Two times a day (BID) | ORAL | Status: DC | PRN
  Administered 2017-03-15: 5 mg via ORAL
  Filled 2017-03-15: qty 1

## 2017-03-15 MED ORDER — LORAZEPAM 2 MG/ML IJ SOLN
1.0000 mg | Freq: Once | INTRAMUSCULAR | Status: AC
  Administered 2017-03-15: 1 mg via INTRAMUSCULAR
  Filled 2017-03-15: qty 1

## 2017-03-15 MED ORDER — STERILE WATER FOR INJECTION IJ SOLN
INTRAMUSCULAR | Status: AC
  Administered 2017-03-15: 02:00:00
  Filled 2017-03-15: qty 10

## 2017-03-15 MED ORDER — LORAZEPAM 1 MG PO TABS
1.0000 mg | ORAL_TABLET | ORAL | Status: AC | PRN
  Administered 2017-03-15: 1 mg via ORAL
  Filled 2017-03-15: qty 1

## 2017-03-15 MED ORDER — BRIMONIDINE TARTRATE 0.2 % OP SOLN
1.0000 [drp] | Freq: Three times a day (TID) | OPHTHALMIC | Status: DC
  Administered 2017-03-15 (×2): 1 [drp] via OPHTHALMIC
  Filled 2017-03-15: qty 5

## 2017-03-15 MED ORDER — DORZOLAMIDE HCL 2 % OP SOLN
1.0000 [drp] | Freq: Three times a day (TID) | OPHTHALMIC | Status: DC
  Administered 2017-03-15 (×2): 1 [drp] via OPHTHALMIC
  Filled 2017-03-15: qty 10

## 2017-03-15 MED ORDER — HALOPERIDOL LACTATE 5 MG/ML IJ SOLN
5.0000 mg | Freq: Once | INTRAMUSCULAR | Status: AC
  Administered 2017-03-15: 5 mg via INTRAMUSCULAR
  Filled 2017-03-15: qty 1

## 2017-03-15 MED ORDER — ZIPRASIDONE MESYLATE 20 MG IM SOLR
20.0000 mg | INTRAMUSCULAR | Status: AC | PRN
  Administered 2017-03-15: 20 mg via INTRAMUSCULAR
  Filled 2017-03-15: qty 20

## 2017-03-15 MED ORDER — ATROPINE SULFATE 1 % OP SOLN
1.0000 [drp] | Freq: Three times a day (TID) | OPHTHALMIC | Status: DC
  Administered 2017-03-15 (×2): 1 [drp] via OPHTHALMIC
  Filled 2017-03-15: qty 2

## 2017-03-15 MED ORDER — LEVETIRACETAM 500 MG PO TABS
500.0000 mg | ORAL_TABLET | Freq: Two times a day (BID) | ORAL | Status: DC
  Administered 2017-03-15 (×3): 500 mg via ORAL
  Filled 2017-03-15 (×4): qty 1

## 2017-03-15 MED ORDER — DIPHENHYDRAMINE HCL 50 MG/ML IJ SOLN
50.0000 mg | Freq: Once | INTRAMUSCULAR | Status: AC
  Administered 2017-03-15: 50 mg via INTRAMUSCULAR
  Filled 2017-03-15: qty 1

## 2017-03-15 MED ORDER — TIMOLOL MALEATE 0.5 % OP SOLN
1.0000 [drp] | Freq: Two times a day (BID) | OPHTHALMIC | Status: DC
  Administered 2017-03-15 (×2): 1 [drp] via OPHTHALMIC
  Filled 2017-03-15: qty 5

## 2017-03-15 NOTE — ED Notes (Signed)
Family is very upset stating they are holding patient against his will-states staff is mistreating patient and pushing him around-states RN hung up on him-caller [name] was verbally abusive on phone-attempted to reassure patient

## 2017-03-15 NOTE — ED Notes (Signed)
[name], patients power of attorney, called and was belligerent and accusatory that we were not treating this patient well.  He accused [place] of over sedating patient although he has not called or visited today since he brought in [place] papers.  [name] was very rude and inappropriate to staff.  He then called back and insisted on speaking to the patient.  I walked this patient to the phone and helped him dial the phone because [name] had hung up.  The patient is still unsteady and drowsy.   [name] then proceeded to call back and continued to yell and not let me speak.   I had to disconnect the call because he was screaming at me.  The patient is being cared for.  We have given him an extra tray after he slept so song.  He is pleasant and cooperative .  He is still somewhat unsteady and confused.  EDP was called and came to check on the patient.  15 minute checks and video monitoring in place.

## 2017-03-15 NOTE — ED Notes (Signed)
Pt does not see well at all.  He has to be helped with his meals and also when walking to the bathroom.

## 2017-03-15 NOTE — ED Notes (Signed)
Pt has medical Power of Berdine Dance [phone].  Other contacts are Jerl Mina [phone],  Jerilee Field [phone],  Uncle Heywood Iles [phone].

## 2017-03-15 NOTE — ED Notes (Signed)
Metal and rubber bracelets removed from pt, pt became very combative, banging on doors and grabbing glass partition.  Pt cursing and very intrusive with staff.  PA Spencer called for orders.  Security and [employer] at bedside for assistance.

## 2017-03-15 NOTE — ED Provider Notes (Signed)
[facility] DEPT Provider Note   CSN: [id number] Arrival date & time: 03/14/17  2340  Time seen 12:20 PM   History   Chief Complaint Chief Complaint  Patient presents with  . Delusional   Level 5 caveat for psychiatric illness  HPI Brent Ramos is a 61 y.o. male.  HPI patient presents to the emergency department via EMS.  They report patient has been having delusions about the devil.  The patient is currently talking and feels that everyone has underlying devil that is out to harm him.  Patient has pressured speech, he cannot be redirected.  He is also talking about he is having a problem in his right eye in the [facility] wants to remove his eye and he feels like that will become a devil.   Past Medical History:  Diagnosis Date  . Alcohol abuse   . Seizures ([place])    "has had them for years" (02/05/2017)    Patient Active Problem List   Diagnosis Date Noted  . 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 Seizure disorder ([place]) 02/05/2017  . Rhabdomyolysis 02/05/2017  . Alcohol abuse 02/05/2017  . Nonadherence to medication 02/05/2017  . Dehydration 02/05/2017  . Recurrent seizures ([place]) 02/05/2017  . Glaucoma 02/05/2017  . Abnormal EKG 02/05/2017    Past Surgical History:  Procedure Laterality Date  . NEPHRECTOMY LIVING DONOR  1970s       Home Medications    Prior to Admission medications   Medication Sig Start Date End Date Taking? Authorizing Provider  atropine 1 % ophthalmic solution Place 1 drop into the right eye 3 (three) times daily.   Yes [provider]  brimonidine (ALPHAGAN) 0.2 % ophthalmic solution Place 1 drop into both eyes 3 (three) times daily.   Yes [provider]  dorzolamide (TRUSOPT) 2 % ophthalmic solution Place 1 drop into both eyes 3 (three) times daily.   Yes [provider]  levETIRAcetam (KEPPRA) 500 MG tablet Take 1 tablet (500 mg  total) by mouth 2 (two) times daily. 02/01/17  Yes Carmon Sails J, PA-C  timolol (TIMOPTIC) 0.5 % ophthalmic solution Place 1 drop into both eyes 2 (two) times daily.   Yes [provider]  zolpidem (AMBIEN) 5 MG tablet Take 1 tablet (5 mg total) by mouth at bedtime as needed for sleep. 02/22/17  Yes Varney Biles, MD    Family History No family history on file.  Social History Social History  Substance Use Topics  . Smoking status: Current Every Day Smoker    Packs/day: 0.50    Years: 45.00    Types: Cigarettes  . Smokeless tobacco: Former Systems developer    Types: Chew  . Alcohol use Yes     Comment: 02/05/2017 "nothing in the last month; h/o abuse"     Allergies   Patient has no known allergies.   Review of Systems Review of Systems  Unable to perform ROS: Psychiatric disorder     Physical Exam Updated Vital Signs BP 125/80 (BP Location: Left Arm)   Pulse (!) 120   Temp 98.6 F (37 C) (Oral)   Resp 18   SpO2 96%   Vital signs normal except for tachycardia   Physical Exam  Constitutional:  Thin man who is sitting up talking with pressured speech  HENT:  Head: Normocephalic and atraumatic.  Right Ear: External ear normal.  Left Ear: External ear normal.  Nose: Nose normal.  Eyes:  Right conjunctiva is injected.  Patient's right eye is deviated laterally mildly  Neck: Normal range of motion. Neck supple.  Cardiovascular: Tachycardia present.   Pulmonary/Chest: Effort normal. No respiratory distress.  Musculoskeletal: He exhibits no deformity.  Neurological: He is alert. No cranial nerve deficit.  Skin: Skin is warm and dry.  Psychiatric: His mood appears anxious. His affect is labile. His speech is rapid and/or pressured. He is agitated. Thought content is paranoid and delusional.  Nursing note and vitals reviewed.    ED Treatments / Results  Labs (all labs ordered are listed, but only abnormal results are displayed) Results for orders placed or  performed during the hospital encounter of 03/14/17  Comprehensive metabolic panel  Result Value Ref Range   Sodium 143 135 - 145 mmol/L   Potassium 4.4 3.5 - 5.1 mmol/L   Chloride 107 101 - 111 mmol/L   CO2 28 22 - 32 mmol/L   Glucose, Bld 99 65 - 99 mg/dL   BUN 15 6 - 20 mg/dL   Creatinine, Ser 1.12 0.61 - 1.24 mg/dL   Calcium 8.9 8.9 - 10.3 mg/dL   Total Protein 7.0 6.5 - 8.1 g/dL   Albumin 4.2 3.5 - 5.0 g/dL   AST 38 15 - 41 U/L   ALT 51 17 - 63 U/L   Alkaline Phosphatase 97 38 - 126 U/L   Total Bilirubin 0.9 0.3 - 1.2 mg/dL   GFR calc non Af Amer >60 >60 mL/min   GFR calc Af Amer >60 >60 mL/min   Anion gap 8 5 - 15  Ethanol  Result Value Ref Range   Alcohol, Ethyl (B) <10 <10 mg/dL  CBC with Differential  Result Value Ref Range   WBC 7.8 4.0 - 10.5 K/uL   RBC 3.86 (L) 4.22 - 5.81 MIL/uL   Hemoglobin 11.1 (L) 13.0 - 17.0 g/dL   HCT 32.9 (L) 39.0 - 52.0 %   MCV 85.2 78.0 - 100.0 fL   MCH 28.8 26.0 - 34.0 pg   MCHC 33.7 30.0 - 36.0 g/dL   RDW 14.7 11.5 - 15.5 %   Platelets 240 150 - 400 K/uL   Neutrophils Relative % 66 %   Neutro Abs 5.1 1.7 - 7.7 K/uL   Lymphocytes Relative 23 %   Lymphs Abs 1.8 0.7 - 4.0 K/uL   Monocytes Relative 9 %   Monocytes Absolute 0.7 0.1 - 1.0 K/uL   Eosinophils Relative 2 %   Eosinophils Absolute 0.2 0.0 - 0.7 K/uL   Basophils Relative 0 %   Basophils Absolute 0.0 0.0 - 0.1 K/uL  Acetaminophen level  Result Value Ref Range   Acetaminophen (Tylenol), Serum <10 (L) 10 - 30 ug/mL  Salicylate level  Result Value Ref Range   Salicylate Lvl <8.4 2.8 - 30.0 mg/dL   Laboratory interpretation all normal except UDS pending    EKG  EKG Interpretation None       Radiology No results found.  Procedures Procedures (including critical care time)  Medications Ordered in ED Medications  OLANZapine zydis (ZYPREXA) disintegrating tablet 5 mg (5 mg Oral Given 03/15/17 0053)  atropine 1 % ophthalmic solution 1 drop (not administered)    brimonidine (ALPHAGAN) 0.2 % ophthalmic solution 1 drop (not administered)  dorzolamide (TRUSOPT) 2 % ophthalmic solution 1 drop (not administered)  levETIRAcetam (KEPPRA) tablet 500 mg (500 mg Oral Given 03/15/17 0053)  timolol (TIMOPTIC) 0.5 % ophthalmic solution 1 drop (1 drop Both Eyes Given 03/15/17 0053)  LORazepam (  ATIVAN) tablet 1 mg (1 mg Oral Given 03/15/17 0144)    And  ziprasidone (GEODON) injection 20 mg (20 mg Intramuscular Given 03/15/17 0144)  sterile water (preservative free) injection (  Given 03/15/17 0152)     Initial Impression / Assessment and Plan / ED Course  I have reviewed the triage vital signs and the nursing notes.  Pertinent labs & imaging results that were available during my care of the patient were reviewed by me and considered in my medical decision making (see chart for details).    Laboratory testing was ordered, patient was started on his home medications including Keppra for his seizure disorder.  He was also started on Zyprexa for his delusions and agitation.  1:34 AM after reviewing patient's laboratory test results, UDS still pending however, TTS consult was ordered and psych holding orders were initiated.  I had already ordered patient's home medications to be continued.  02:00 AM Lytle Michaels, TTS has evaluated patient and recommends inpatient admission. So far patient has been cooperative for treatment.   Final Clinical Impressions(s) / ED Diagnoses   Final diagnoses:  Delusions [facility])    Plan inpatient admission  Rolland Porter, MD, Barbette Or, MD 03/15/17 [phone]

## 2017-03-15 NOTE — ED Notes (Signed)
Patient currently refusing blood draw.

## 2017-03-15 NOTE — ED Notes (Signed)
Pt woke up and went to bathroom.  Patient was very groggy and unsteady on his feet.  He had to be escorted to bathroom for safety.  Patient immediately went back to sleep.

## 2017-03-15 NOTE — ED Notes (Signed)
Pt resting at present, no distress noted, eating a snack at present.  Awake, alert & responsive,  Monitoring for safety, Q 15 min checks in effect.

## 2017-03-15 NOTE — ED Notes (Signed)
Bed: WA27 Expected date:  Expected time:  Means of arrival:  Comments: 

## 2017-03-15 NOTE — ED Notes (Signed)
Pt presents Delusional, states seeing the Devil.  Pt labile, irritable, angry.  Refusing to take bracelets off.  Security and [employer] called for assistance.  Awake, alert & responsive, no distress noted, Sleeping at present.  Monitoring for safety, Q 15 min checks in effect.  Safety check for contraband completed, no other items found.

## 2017-03-15 NOTE — BH Assessment (Addendum)
Assessment Note  Brent Ramos is an 61 y.o. male, who present voluntary and unaccompanied to [facility]. Clinician introduced herself to the pt. Clinician observed the pt getting up while saying, "hey girl give me a hug." Pt continued, "I know you, I know you from Halloween." Pt reported, "angels in my room, saw the same." During the assessment the pt talk about various topics.   Per EDP note: "Patient presents to the emergency department via EMS.  They report patient has been having delusions about the devil.  The patient is currently talking and feels that everyone has underlying devil that is out to harm him.  Patient has pressured speech, he cannot be redirected.  He is also talking about he is having a problem in his right eye in the [facility] wants to remove his eye and he feels like that will become a devil."   Clinician was unable to assess the following: martial status, legal guardian, OPT resources, SI, HI, sleep, appetite, symptoms of depression, DSS involvement, family supports, access to weapons, history of abuse, educational status, contract for safety, substance use, orientation, legal involvement, self-injurious behaviors, previous inpatient admissions. Pt's UDS is pending.  Pt presents bizarre  In scrubs with pressured, rapid speech. Pt's eye contact poor. Pt's mood/affect was anxious. Pt's thought process was a flight of ideas. Pt's judgement was impaired. Pt's concentration, insight and impulse control are poor.   Diagnosis: F31.2 Bipolar I disorder, Current Episode Manic, With Psychotic Features.  Past Medical History:  Past Medical History:  Diagnosis Date  . Alcohol abuse   . Seizures ([place])    "has had them for years" (02/05/2017)    Past Surgical History:  Procedure Laterality Date  . NEPHRECTOMY LIVING DONOR  1970s    Family History: No family history on file.  Social History:  reports that he has been smoking Cigarettes.  He has a 22.50 pack-year smoking history. He  has quit using smokeless tobacco. His smokeless tobacco use included Chew. He reports that he drinks alcohol. He reports that he does not use drugs.  Additional Social History:  Alcohol / Drug Use Pain Medications: See MAR Prescriptions: See MAR Over the Counter: See MAR History of alcohol / drug use?:  (Pt's UDS is pending. )  CIWA: CIWA-Ar BP: 126/78 Pulse Rate: 97 COWS:    Allergies: No Known Allergies  Home Medications:  (Not in a hospital admission)  OB/GYN Status:  No LMP for male patient.  General Assessment Data Location of Assessment: [facility] ED TTS Assessment: In system Is this a Tele or Face-to-Face Assessment?: Face-to-Face Is this an Initial Assessment or a Re-assessment for this encounter?: Initial Assessment Marital status: Single Is patient pregnant?: No Pregnancy Status: No Living Arrangements: Other (Comment) (Per chart, POA [name]. ) Can pt return to current living arrangement?:  (UTA) Admission Status: Voluntary Is patient capable of signing voluntary admission?: No Referral Source: Self/Family/Friend Insurance type: Medicaid     Crisis Care Plan Living Arrangements: Other (Comment) (Per chart, POA [name]. ) Legal Guardian: Other: (Unknown) Name of Psychiatrist: [place] Name of Therapist: UTA  Education Status Is patient currently in school?:  (UTA) Current Grade: UTA Highest grade of school patient has completed: [employer] Name of school: UTA Contact person: UTA  Risk to self with the past 6 months Suicidal Ideation:  (UTA) Has patient been a risk to self within the past 6 months prior to admission? : Other (comment) (UTA) Suicidal Intent:  (UTA) Has patient had any  suicidal intent within the past 6 months prior to admission? : Other (comment) (UTA) Is patient at risk for suicide?:  (UTA) Suicidal Plan?:  (UTA) Has patient had any suicidal plan within the past 6 months prior to admission? : Other (comment) (UTA) What has been your use of  drugs/alcohol within the last 12 months?: Pt's UDS is pending. Previous Attempts/Gestures:  (UTA) How many times?:  (UTA) Other Self Harm Risks: UTA Triggers for Past Attempts: Unknown Intentional Self Injurious Behavior:  (UTA) Family Suicide History: Unable to assess Recent stressful life event(s):  (UTA) Persecutory voices/beliefs?:  (UTA) Depression:  (UTA) Depression Symptoms:  (UTA) Substance abuse history and/or treatment for substance abuse?: No Suicide prevention information given to non-admitted patients: Not applicable  Risk to Others within the past 6 months Homicidal Ideation:  (UTA) Does patient have any lifetime risk of violence toward others beyond the six months prior to admission? : Unknown Thoughts of Harm to Others:  (UTA) Current Homicidal Intent:  (UTA) Current Homicidal Plan:  (UTA) Access to Homicidal Means:  (UTA) Identified Victim: UTA History of harm to others?:  (UTA) Assessment of Violence:  (UTA) Violent Behavior Description: UTA Does patient have access to weapons?:  (UTA) Criminal Charges Pending?:  (UTA) Does patient have a court date:  (UTA) Is patient on probation?: Unknown  Psychosis Hallucinations: Auditory, Visual Delusions: Persecutory  Mental Status Report Appearance/Hygiene: Bizarre, In scrubs Eye Contact: Poor Motor Activity: Freedom of movement Speech: Pressured, Rapid Level of Consciousness: Alert Mood: Anxious Affect: Anxious Anxiety Level: Moderate Thought Processes: Flight of Ideas Judgement: Impaired Orientation: Unable to assess Obsessive Compulsive Thoughts/Behaviors: Unable to Assess  Cognitive Functioning Concentration: Poor Memory: Recent Impaired, Remote Impaired IQ: Average Insight: Poor Impulse Control: Poor Appetite:  (UTA) Sleep:  (UTA) Vegetative Symptoms: Unable to Assess  ADLScreening [facility] Assessment Services) Patient's cognitive ability adequate to safely complete daily activities?: Yes Patient  able to express need for assistance with ADLs?: Yes Independently performs ADLs?: No (Per chart.)  Prior Inpatient Therapy Prior Inpatient Therapy:  (UTA) Prior Therapy Dates: UTA Prior Therapy Facilty/Provider(s): UTA Reason for Treatment: UTA  Prior Outpatient Therapy Prior Outpatient Therapy:  [facility]) Prior Therapy Dates: UTA Prior Therapy Facilty/Provider(s): UTA Reason for Treatment: UTA Does patient have an ACCT team?: Unknown Does patient have Intensive In-House Services?  : Unknown Does patient have [facility] services? 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 ) Independently performs ADLs?: No (Per chart.) Communication: Independent (Per chart.) Dressing (OT): Needs assistance (Per chart. ) Is this a change from baseline?: Pre-admission baseline (Per chart. ) Grooming: Independent (Per chart. ) Feeding: Independent (Per chart. ) Bathing: Needs assistance (Per chart. ) Is this a change from baseline?: Pre-admission baseline (Per chart. ) Toileting: Independent (Per chart. ) In/Out Bed: Needs assistance (Per chart. ) Is this a change from baseline?: Pre-admission baseline (Per chart. ) Walks in Home: Needs assistance (Per chart. ) Is this a change from baseline?: Pre-admission baseline (Per chart. ) Does the patient have difficulty walking  or climbing stairs?: Yes (Per chart. ) Weakness of Legs: Both (Per chart. ) Weakness  of Arms/Hands: Both (Per chart. )  Home Assistive Devices/Equipment Home Assistive Devices/Equipment: Grab bars in shower (Per chart. )    Abuse/Neglect Assessment (Assessment to be complete while patient is alone) Physical Abuse:  (UTA) Verbal Abuse:  (UTA) Sexual Abuse:  (UTA) Exploitation of patient/patient's resources:  (UTA) Self-Neglect:  (UTA)     Advance Directives (For Healthcare) Does Patient Have a Medical Advance Directive?: Yes (Per chart.) Type of Advance Directive: Press photographer (Per chart.) Copy of [place] in Chart?: Yes (Per chart.) Would patient like information on creating a medical advance directive?: No - Patient declined (Per chart.)    Additional Information 1:1 In Past 12 Months?: No CIRT Risk: No Elopement Risk: No Does patient have medical clearance?: Yes     Disposition: Patriciaann Clan, PA recommends inpatient treatment. Disposition discussed with Dr. Tomi Bamberger and Lisette Grinder, RN. TTS to seek placement.    Disposition Initial Assessment Completed for this Encounter: Yes Disposition of Patient: Inpatient treatment program Type of inpatient treatment program: Adult  On Site Evaluation by:   Reviewed with Physician: Dr. Tomi Bamberger and Patriciaann Clan, PA.   Vertell Novak 03/15/2017 2:48 AM    Vertell Novak, MS, [facility], [place] Triage Specialist [phone]

## 2017-03-16 DIAGNOSIS — F4325 Adjustment disorder with mixed disturbance of emotions and conduct: Secondary | ICD-10-CM

## 2017-03-16 DIAGNOSIS — F17210 Nicotine dependence, cigarettes, uncomplicated: Secondary | ICD-10-CM

## 2017-03-16 MED ORDER — OLANZAPINE 10 MG IM SOLR: 10.0000 mg | Freq: Once | INTRAMUSCULAR | Status: DC

## 2017-03-16 MED ORDER — DIPHENHYDRAMINE HCL 50 MG/ML IJ SOLN: 50.0000 mg | Freq: Once | INTRAMUSCULAR | Status: DC

## 2017-03-16 NOTE — ED Notes (Signed)
At the time of discharge pt did not want to call his POA. He said, "He cannot come and get me." According to security guard, Maylon Cos, he was driven to the bus stop by security and given a bus ticket. Pt said that he would take a bus home.

## 2017-03-16 NOTE — BH Assessment (Signed)
[facility] Assessment Progress Note  Per Buford Dresser, DO, this pt does not require psychiatric hospitalization at this time.  Pt is to be discharged from [facility] with recommendation to follow up with [facility].  This has been included in pt's discharge instructions.  Pt's nurse, Diane, has been notified.  Jalene Mullet, [place] Triage Specialist [phone]

## 2017-03-16 NOTE — Discharge Instructions (Signed)
For your mental health needs, you are advised to follow up with [facility].  New and returning patients are seen at their walk-in clinic.  Walk-in hours are Monday - Friday from 8:00 am - 3:00 pm.  Walk-in patients are seen on a first come, first served basis.  Try to arrive as early as possible for he best chance of being seen the same day: ° °     [facility] °     [address] °     [place], [place] [address] °     [phone]

## 2017-03-16 NOTE — Consult Note (Signed)
[facility] Face-to-Face Psychiatry Consult   Reason for Consult:  Confusion  Referring Physician:  EDP Patient Identification: Brent Ramos MRN:  [id number] Principal Diagnosis: Adjustment disorder with mixed disturbance of emotions and conduct Diagnosis:   Patient Active Problem List   Diagnosis Date Noted  . Adjustment disorder with mixed disturbance of emotions and conduct [F43.25] 02/22/2017    Priority: High  . Seizure disorder ([place]) [G62.694] 02/05/2017  . Rhabdomyolysis [M62.82] 02/05/2017  . Alcohol abuse [F10.10] 02/05/2017  . Nonadherence to medication [Z91.14] 02/05/2017  . Dehydration [E86.0] 02/05/2017  . Recurrent seizures ([place]) [W54.627] 02/05/2017  . Glaucoma [H40.9] 02/05/2017  . Abnormal EKG [R94.31] 02/05/2017    Total Time spent with patient: 45 minutes  Subjective:   Brent Ramos is a 61 y.o. male patient does not warrant admission  HPI:  69 male who presented to the ED via his caregiver after picking him up from jail.  Some confusion yesterday but he is clear and coherent today.  He wants more sausage and a Coke.  No suicidal/homicidal ideations, hallucinations, or substance abuse issues.  He wants to go home, stable for discharge.  Past Psychiatric History: alcohol abuse  Risk to Self: Denies Risk to Others: Denies  Prior Inpatient Therapy: Unknown  Prior Outpatient Therapy: Unknown   Past Medical History:  Past Medical History:  Diagnosis Date  . Alcohol abuse   . Seizures ([place])    "has had them for years" (02/05/2017)    Past Surgical History:  Procedure Laterality Date  . NEPHRECTOMY LIVING DONOR  1970s   Family History: No family history on file. Family Psychiatric  History: none Social History:  History  Alcohol Use  . Yes    Comment: 02/05/2017 "nothing in the last month; h/o abuse"     History  Drug Use No    Social History   Social History  . Marital status: Divorced    Spouse name: N/A  . Number of children: N/A  . Years of  education: N/A   Social History Main Topics  . Smoking status: Current Every Day Smoker    Packs/day: 0.50    Years: 45.00    Types: Cigarettes  . Smokeless tobacco: Former Systems developer    Types: Chew  . Alcohol use Yes     Comment: 02/05/2017 "nothing in the last month; h/o abuse"  . Drug use: No  . Sexual activity: No   Other Topics Concern  . None   Social History Narrative  . None   Additional Social History: N/A    Allergies:  No Known Allergies  Labs:  Results for orders placed or performed during the hospital encounter of 03/14/17 (from the past 48 hour(s))  Comprehensive metabolic panel     Status: None   Collection Time: 03/15/17 12:42 AM  Result Value Ref Range   Sodium 143 135 - 145 mmol/L   Potassium 4.4 3.5 - 5.1 mmol/L   Chloride 107 101 - 111 mmol/L   CO2 28 22 - 32 mmol/L   Glucose, Bld 99 65 - 99 mg/dL   BUN 15 6 - 20 mg/dL   Creatinine, Ser 1.12 0.61 - 1.24 mg/dL   Calcium 8.9 8.9 - 10.3 mg/dL   Total Protein 7.0 6.5 - 8.1 g/dL   Albumin 4.2 3.5 - 5.0 g/dL   AST 38 15 - 41 U/L   ALT 51 17 - 63 U/L   Alkaline Phosphatase 97 38 - 126 U/L   Total Bilirubin 0.9 0.3 -  1.2 mg/dL   GFR calc non Af Amer >60 >60 mL/min   GFR calc Af Amer >60 >60 mL/min    Comment: (NOTE) The eGFR has been calculated using the CKD EPI equation. This calculation has not been validated in all clinical situations. eGFR's persistently <60 mL/min signify possible Chronic Kidney Disease.    Anion gap 8 5 - 15  Ethanol     Status: None   Collection Time: 03/15/17 12:42 AM  Result Value Ref Range   Alcohol, Ethyl (B) <10 <10 mg/dL    Comment:        LOWEST DETECTABLE LIMIT FOR SERUM ALCOHOL IS 10 mg/dL FOR MEDICAL PURPOSES ONLY   CBC with Differential     Status: Abnormal   Collection Time: 03/15/17 12:42 AM  Result Value Ref Range   WBC 7.8 4.0 - 10.5 K/uL   RBC 3.86 (L) 4.22 - 5.81 MIL/uL   Hemoglobin 11.1 (L) 13.0 - 17.0 g/dL   HCT 32.9 (L) 39.0 - 52.0 %   MCV 85.2 78.0  - 100.0 fL   MCH 28.8 26.0 - 34.0 pg   MCHC 33.7 30.0 - 36.0 g/dL   RDW 14.7 11.5 - 15.5 %   Platelets 240 150 - 400 K/uL   Neutrophils Relative % 66 %   Neutro Abs 5.1 1.7 - 7.7 K/uL   Lymphocytes Relative 23 %   Lymphs Abs 1.8 0.7 - 4.0 K/uL   Monocytes Relative 9 %   Monocytes Absolute 0.7 0.1 - 1.0 K/uL   Eosinophils Relative 2 %   Eosinophils Absolute 0.2 0.0 - 0.7 K/uL   Basophils Relative 0 %   Basophils Absolute 0.0 0.0 - 0.1 K/uL  Acetaminophen level     Status: Abnormal   Collection Time: 03/15/17 12:42 AM  Result Value Ref Range   Acetaminophen (Tylenol), Serum <10 (L) 10 - 30 ug/mL    Comment:        THERAPEUTIC CONCENTRATIONS VARY SIGNIFICANTLY. A RANGE OF 10-30 ug/mL MAY BE AN EFFECTIVE CONCENTRATION FOR MANY PATIENTS. HOWEVER, SOME ARE BEST TREATED AT CONCENTRATIONS OUTSIDE THIS RANGE. ACETAMINOPHEN CONCENTRATIONS >150 ug/mL AT 4 HOURS AFTER INGESTION AND >50 ug/mL AT 12 HOURS AFTER INGESTION ARE OFTEN ASSOCIATED WITH TOXIC REACTIONS.   Salicylate level     Status: None   Collection Time: 03/15/17 12:42 AM  Result Value Ref Range   Salicylate Lvl <5.3 2.8 - 30.0 mg/dL    Current Facility-Administered Medications  Medication Dose Route Frequency Provider Last Rate Last Dose  . atropine 1 % ophthalmic solution 1 drop  1 drop Right Eye TID Rolland Porter, MD   1 drop at 03/15/17 2202  . brimonidine (ALPHAGAN) 0.2 % ophthalmic solution 1 drop  1 drop Both Eyes TID Rolland Porter, MD   1 drop at 03/15/17 2203  . dorzolamide (TRUSOPT) 2 % ophthalmic solution 1 drop  1 drop Both Eyes TID Rolland Porter, MD   1 drop at 03/15/17 2203  . levETIRAcetam (KEPPRA) tablet 500 mg  500 mg Oral BID Rolland Porter, MD   500 mg at 03/15/17 2203  . OLANZapine zydis (ZYPREXA) disintegrating tablet 5 mg  5 mg Oral BID PRN Rolland Porter, MD   5 mg at 03/15/17 0053  . timolol (TIMOPTIC) 0.5 % ophthalmic solution 1 drop  1 drop Both Eyes BID Rolland Porter, MD   1 drop at 03/15/17 2203   Current  Outpatient Prescriptions  Medication Sig Dispense Refill  . atropine 1 % ophthalmic solution Place  1 drop into the right eye 3 (three) times daily.    . brimonidine (ALPHAGAN) 0.2 % ophthalmic solution Place 1 drop into both eyes 3 (three) times daily.    . dorzolamide (TRUSOPT) 2 % ophthalmic solution Place 1 drop into both eyes 3 (three) times daily.    Marland Kitchen levETIRAcetam (KEPPRA) 500 MG tablet Take 1 tablet (500 mg total) by mouth 2 (two) times daily. 15 tablet 0  . timolol (TIMOPTIC) 0.5 % ophthalmic solution Place 1 drop into both eyes 2 (two) times daily.    Marland Kitchen zolpidem (AMBIEN) 5 MG tablet Take 1 tablet (5 mg total) by mouth at bedtime as needed for sleep. (Patient not taking: Reported on 03/15/2017) 5 tablet 0    Musculoskeletal: Strength & Muscle Tone: within normal limits Gait & Station: normal Patient leans: N/A  Psychiatric Specialty Exam: Physical Exam  Constitutional: He is oriented to person, place, and time. He appears well-developed and well-nourished.  HENT:  Head: Normocephalic.  Neck: Normal range of motion.  Respiratory: Effort normal.  Musculoskeletal: Normal range of motion.  Neurological: He is alert and oriented to person, place, and time.  Psychiatric: He has a normal mood and affect. His speech is normal and behavior is normal. Judgment and thought content normal. Cognition and memory are normal.    Review of Systems  All other systems reviewed and are negative.   Blood pressure (!) 118/103, pulse 79, temperature 98.6 F (37 C), temperature source Oral, resp. rate 18, SpO2 99 %.There is no height or weight on file to calculate BMI.  General Appearance: Casual  Eye Contact:  Good  Speech:  Normal Rate  Volume:  Normal  Mood:  Irritable, mild  Affect:  Congruent  Thought Process:  Coherent and Descriptions of Associations: Intact  Orientation:  Full (Time, Place, and Person)  Thought Content:  WDL and Logical  Suicidal Thoughts:  No  Homicidal Thoughts:   No  Memory:  Immediate;   Good Recent;   Good Remote;   Good  Judgement:  Fair  Insight:  Fair  Psychomotor Activity:  Normal  Concentration:  Concentration: Good and Attention Span: Good  Recall:  Good  Fund of Knowledge:  Fair  Language:  Good  Akathisia:  No  Handed:  Right  AIMS (if indicated):     Assets:  Leisure Time Physical Health Resilience Social Support  ADL's:  Intact  Cognition:  WNL  Sleep:        Treatment Plan Summary: Daily contact with patient to assess and evaluate symptoms and progress in treatment, Medication management and Plan adjustment disorder with mixed disturbance of emotions and conduct:  -Crisis stabilization -Medication management:  Continued medical medications, no psychiatric medications needed, confusion cleared -Individual counseling  Disposition: No evidence of imminent risk to self or others at present.    Waylan Boga, NP 03/16/2017 10:16 AM   Patient seen face-to-face for psychiatric evaluation, chart reviewed and case discussed with the physician extender and developed treatment plan. Reviewed the information documented and agree with the treatment plan.  Buford Dresser, DO

## 2017-03-16 NOTE — ED Notes (Signed)
Pt verbalized readiness for discharge. Denied SI/HI/AVH. Refused medications and said, "I will take them at home. Pt was irritable and demanded food and soft drinks outside of unit snack times. He went into another pt's room and took their food tray.

## 2017-03-16 NOTE — [facility] Suicide Risk Assessment (Signed)
Suicide Risk Assessment  Discharge Assessment   [facility] Discharge Suicide Risk Assessment   Principal Problem: Adjustment disorder with mixed disturbance of emotions and conduct Discharge Diagnoses:  Patient Active Problem List   Diagnosis Date Noted  . Adjustment disorder with mixed disturbance of emotions and conduct [F43.25] 02/22/2017    Priority: High  . Seizure disorder ([place]) [J33.545] 02/05/2017  . Rhabdomyolysis [M62.82] 02/05/2017  . Alcohol abuse [F10.10] 02/05/2017  . Nonadherence to medication [Z91.14] 02/05/2017  . Dehydration [E86.0] 02/05/2017  . Recurrent seizures ([place]) [G25.638] 02/05/2017  . Glaucoma [H40.9] 02/05/2017  . Abnormal EKG [R94.31] 02/05/2017    Total Time spent with patient: 45 minutes   MMusculoskeletal: Strength & Muscle Tone: within normal limits Gait & Station: normal Patient leans: N/A  Psychiatric Specialty Exam: Physical Exam  Constitutional: He is oriented to person, place, and time. He appears well-developed and well-nourished.  HENT:  Head: Normocephalic.  Neck: Normal range of motion.  Respiratory: Effort normal.  Musculoskeletal: Normal range of motion.  Neurological: He is alert and oriented to person, place, and time.  Psychiatric: He has a normal mood and affect. His speech is normal and behavior is normal. Judgment and thought content normal. Cognition and memory are normal.    Review of Systems  All other systems reviewed and are negative.   Blood pressure (!) 118/103, pulse 79, temperature 98.6 F (37 C), temperature source Oral, resp. rate 18, SpO2 99 %.There is no height or weight on file to calculate BMI.  General Appearance: Casual  Eye Contact:  Good  Speech:  Normal Rate  Volume:  Normal  Mood:  Irritable, mild  Affect:  Congruent  Thought Process:  Coherent and Descriptions of Associations: Intact  Orientation:  Full (Time, Place, and Person)  Thought Content:  WDL and Logical  Suicidal Thoughts:  No  Homicidal  Thoughts:  No  Memory:  Immediate;   Good Recent;   Good Remote;   Good  Judgement:  Fair  Insight:  Fair  Psychomotor Activity:  Normal  Concentration:  Concentration: Good and Attention Span: Good  Recall:  Good  Fund of Knowledge:  Fair  Language:  Good  Akathisia:  No  Handed:  Right  AIMS (if indicated):     Assets:  Leisure Time Physical Health Resilience Social Support  ADL's:  Intact  Cognition:  WNL  Sleep:       Mental Status Per Nursing Assessment::   On Admission:   confusion  Demographic Factors:  Male  Loss Factors: NA  Historical Factors: NA  Risk Reduction Factors:   Sense of responsibility to family, Living with another person, especially a relative and Positive social support  Continued Clinical Symptoms:  Irritable, mild  Cognitive Features That Contribute To Risk:  None    Suicide Risk:  Minimal: No identifiable suicidal ideation.  Patients presenting with no risk factors but with morbid ruminations; may be classified as minimal risk based on the severity of the depressive symptoms    Plan Of Care/Follow-up recommendations:  Activity:  as tolerated Diet:  heart healthy diet  Ruthvik Barnaby, NP 03/16/2017, 4:03 PM

## 2017-03-19 ENCOUNTER — Encounter: Payer: Self-pay | Admitting: Neurology

## 2017-03-21 ENCOUNTER — Encounter (HOSPITAL_COMMUNITY): Payer: Self-pay | Admitting: *Deleted

## 2017-03-21 ENCOUNTER — Emergency Department (HOSPITAL_COMMUNITY): Payer: Medicaid Other

## 2017-03-21 ENCOUNTER — Emergency Department (HOSPITAL_COMMUNITY)
Admission: EM | Admit: 2017-03-21 | Discharge: 2017-03-21 | Disposition: A | Discharge status: Home / Self Care | Payer: Medicaid Other | Attending: Emergency Medicine | Admitting: Emergency Medicine

## 2017-03-21 DIAGNOSIS — R519 Headache, unspecified: Secondary | ICD-10-CM

## 2017-03-21 DIAGNOSIS — M791 Myalgia, unspecified site: Secondary | ICD-10-CM | POA: Diagnosis not present

## 2017-03-21 DIAGNOSIS — R739 Hyperglycemia, unspecified: Secondary | ICD-10-CM | POA: Insufficient documentation

## 2017-03-21 DIAGNOSIS — M79671 Pain in right foot: Secondary | ICD-10-CM | POA: Insufficient documentation

## 2017-03-21 DIAGNOSIS — R51 Headache: Secondary | ICD-10-CM | POA: Insufficient documentation

## 2017-03-21 DIAGNOSIS — M7989 Other specified soft tissue disorders: Secondary | ICD-10-CM

## 2017-03-21 DIAGNOSIS — M79672 Pain in left foot: Secondary | ICD-10-CM | POA: Diagnosis not present

## 2017-03-21 DIAGNOSIS — Z79899 Other long term (current) drug therapy: Secondary | ICD-10-CM | POA: Diagnosis not present

## 2017-03-21 DIAGNOSIS — R2243 Localized swelling, mass and lump, lower limb, bilateral: Secondary | ICD-10-CM | POA: Insufficient documentation

## 2017-03-21 DIAGNOSIS — H53141 Visual discomfort, right eye: Secondary | ICD-10-CM | POA: Diagnosis not present

## 2017-03-21 DIAGNOSIS — F17210 Nicotine dependence, cigarettes, uncomplicated: Secondary | ICD-10-CM | POA: Diagnosis not present

## 2017-03-21 LAB — CBC
HEMATOCRIT: 33.6 % — AB (ref 39.0–52.0)
HEMOGLOBIN: 10.6 g/dL — AB (ref 13.0–17.0)
MCH: 28 pg (ref 26.0–34.0)
MCHC: 31.5 g/dL (ref 30.0–36.0)
MCV: 88.7 fL (ref 78.0–100.0)
Platelets: 296 10*3/uL (ref 150–400)
RBC: 3.79 MIL/uL — AB (ref 4.22–5.81)
RDW: 15.5 % (ref 11.5–15.5)
WBC: 6.1 10*3/uL (ref 4.0–10.5)

## 2017-03-21 LAB — BASIC METABOLIC PANEL
Anion gap: 6 (ref 5–15)
BUN: 16 mg/dL (ref 6–20)
CHLORIDE: 108 mmol/L (ref 101–111)
CO2: 26 mmol/L (ref 22–32)
Calcium: 8.6 mg/dL — ABNORMAL LOW (ref 8.9–10.3)
Creatinine, Ser: 0.98 mg/dL (ref 0.61–1.24)
GFR calc Af Amer: >60 mL/min (ref >60)
GFR calc non Af Amer: >60 mL/min (ref >60)
GLUCOSE: 121 mg/dL — AB (ref 65–99)
POTASSIUM: 3.9 mmol/L (ref 3.5–5.1)
Sodium: 140 mmol/L (ref 135–145)

## 2017-03-21 MED ORDER — KETOROLAC TROMETHAMINE 10 MG PO TABS
10.0000 mg | ORAL_TABLET | Freq: Once | ORAL | Status: AC
  Administered 2017-03-21: 10 mg via ORAL
  Filled 2017-03-21: qty 1

## 2017-03-21 MED ORDER — METOCLOPRAMIDE HCL 10 MG PO TABS
10.0000 mg | ORAL_TABLET | Freq: Once | ORAL | Status: AC
  Administered 2017-03-21: 10 mg via ORAL
  Filled 2017-03-21: qty 1

## 2017-03-21 MED ORDER — ACETAMINOPHEN 325 MG PO TABS: 650.0000 mg | ORAL_TABLET | Freq: Four times a day (QID) | ORAL | 0 refills | Status: DC | PRN
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
## 2017-03-21 NOTE — ED Notes (Signed)
Waiting for pt to come back from xray to get blood

## 2017-03-21 NOTE — Discharge Instructions (Addendum)
The x-rays of your ankles do not show fracture.  The scan of your head does not show bleed or acute abnormality.  You can take 650mg  tylenol every 6hrs for headache and leg pain.   Please schedule an appointment with your primary doctor to discuss lower leg swelling and potential need to start a medicine for this.   Please return to the emergency department if you have a headache in which you can no longer feel your legs or feet, headache with double vision or complete loss of vision, headache with vomiting that will not stop.  Please also return for any new or worsening symptoms.

## 2017-03-21 NOTE — ED Notes (Signed)
Pt requested something else to eat. He requested another [place] sandwich, graham crackers, peanut butter, apple juice and sprite. Pt given the same. This tech spreads peanut butter on graham crackers and mustard and mayo on [place] because pt says he is blind.
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
## 2017-05-30 ENCOUNTER — Encounter: Payer: Self-pay | Admitting: Neurology

## 2017-05-30 ENCOUNTER — Ambulatory Visit: Payer: Medicaid Other | Admitting: Neurology

## 2017-05-30 VITALS — BP 120/76 | HR 61 | Ht 73.0 in | Wt 201.0 lb

## 2017-05-30 DIAGNOSIS — G40009 Localization-related (focal) (partial) idiopathic epilepsy and epileptic syndromes with seizures of localized onset, not intractable, without status epilepticus: Secondary | ICD-10-CM

## 2017-05-30 MED ORDER — LEVETIRACETAM 500 MG PO TABS: 500.0000 mg | ORAL_TABLET | Freq: Two times a day (BID) | ORAL | 11 refills | Status: DC

## 2017-05-30 NOTE — Progress Notes (Signed)
NEUROLOGY CONSULTATION NOTE  Brent Ramos MRN: [id number] DOB: 1955/07/29  Referring provider: Dr. Gala Ramos Primary care provider: Dr. Gala Ramos  Reason for consult:  seizures  Dear Dr Brent Ramos:  Thank you for your kind referral of Brent Ramos for consultation of the above symptoms. Although his history is well known to you, please allow me to reiterate it for the purpose of our medical record. The patient was accompanied to the clinic by his [name], who also provides collateral information. Records and images were personally reviewed where available.   HISTORY OF PRESENT ILLNESS: This is a pleasant 62 year old right-handed man with a history of alcohol abuse, seizures, presenting to establish care. He is accompanied by his POA and roommate Brent Ramos, who provides majority of the history. They report that seizures started in his 53s. He has no prior warning before the seizures. Brent Ramos has known him for 12 years and has witnessed several seizures. He would start staring off, answers "what" but does not follow commands, followed by left head deviation then 20 seconds later a generalized convulsion. Sometimes he has clusters of 4-5 seizures lasting 1-2 minutes at a time. He had been taking Dilantin since his 54s, up to 7-8 months ago when he started having balance issues and "jerkiness." Brent Ramos noted that when he stands, he would be rocking and shaking when standing still. He has jerking in sleep that wakes him up. He was switched to Keppra, and this seemed to help better. He was in the ER in May 2018 for seizures in the setting of alcohol abuse, alcohol level 52. Brent Ramos reports that the pharmacy did not deliver [place] last September and he was off medication for a whole weekend then started having seizures. He was in the ER on 01/31/17 (per ER notes he was out of medication for 2 months due to finances). CT head showed mild atrophy, no acute changes. He was given a prescription for  Keppra and discharged, then was back 3 days later after another seizure. He was then admitted on 02/05/17 after he had 6 witnessed seizures with rhabdomyolysis (CK >9000). His EEG at that time showed excess diffuse beta activity, no epileptiform discharges. He was discharged home, however Brent Ramos reports that once he got home, he was awake for 4-5 days, saying he would clean the room but pulled things out of drawers and left them askew. He was hallucinating and wandered off, Brent Ramos had to call Missing Persons 6 times, until finally the patient asked the police to take him to the ER on 02/20/17.  While in the ER, he was noted to have an episode of unresponsiveness. He was evaluated by [facility] and started on Zyprexa. Brent Ramos has known him for 12 years and has never seen any manic behavior until that time, but states "he has always been mean and agitated," but has significantly improved on Zyprexa. He stopped drinking in August per Brent Ramos. He used to drink 2-3 18-pack of beer within 3 days. He is taking Keppra 500mg  BID without side effects, no seizures since September 2018.   He used to have a lot of headaches, but these seemed to ease up since starting the [place]. He occasionally gets dizzy upon standing. 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 He has occasional tingling in both hands and stinging on both feet. They got cold quickly. He has a lot of back pain. No bowel/bladder dysfunction. He denies any olfactory/gustatory hallucinations, deja  vu, rising epigastric sensation, myoclonic jerks. Brent Ramos reports that the patient has "always been a little off" since he has known him. He needs things repeated for him. Brent Ramos has always been in charge of bills. He does not drive. Over the past month, the patient has been taking his own medications, Brent Ramos ensures he takes them.   Epilepsy Risk Factors:  He had a normal birth and early development.  There is no history of febrile convulsions,  CNS infections such as meningitis/encephalitis, significant traumatic brain injury, neurosurgical procedures, or family history of seizures.  PAST MEDICAL HISTORY: Past Medical History:  Diagnosis Date  . Alcohol abuse   . Memory loss   . Seizures ([place])    "has had them for years" (02/05/2017)  . Vision abnormalities     PAST SURGICAL HISTORY: Past Surgical History:  Procedure Laterality Date  . NEPHRECTOMY LIVING DONOR  1970s    MEDICATIONS: Current Outpatient Medications on File Prior to Visit  Medication Sig Dispense Refill  . acetaminophen (TYLENOL) 325 MG tablet Take 2 tablets (650 mg total) every 6 (six) hours as needed by mouth. 30 tablet 0  . levETIRAcetam (KEPPRA) 500 MG tablet Take 1 tablet (500 mg total) by mouth 2 (two) times daily. 15 tablet 0  . OLANZapine (ZYPREXA) 5 MG tablet   0   No current facility-administered medications on file prior to visit.     ALLERGIES: No Known Allergies  FAMILY HISTORY: Family History  Problem Relation Age of Onset  . Dementia Mother     SOCIAL HISTORY: Social History   Socioeconomic History  . Marital status: Divorced    Spouse name: Not on file  . Number of children: Not on file  . Years of education: Not on file  . Highest education level: Not on file  Social Needs  . Financial resource strain: Not on file  . Food insecurity - worry: Not on file  . Food insecurity - inability: Not on file  . Transportation needs - medical: Not on file  . Transportation needs - non-medical: Not on file  Occupational History  . Not on file  Tobacco Use  . Smoking status: Current Every Day Smoker    Packs/day: 0.50    Years: 45.00    Pack years: 22.50    Types: Cigarettes  . Smokeless tobacco: Former Systems developer    Types: Chew  Substance and Sexual Activity  . Alcohol use: Yes    Comment: 02/05/2017 "nothing in the last month; h/o abuse"  . Drug use: No  . Sexual activity: No  Other Topics Concern  . Not on file  Social History  Narrative  . Not on file    REVIEW OF SYSTEMS: Constitutional: No fevers, chills, or sweats, no generalized fatigue, change in appetite Eyes: No visual changes, double vision, eye pain Ear, nose and throat: No hearing loss, ear pain, nasal congestion, sore throat Cardiovascular: No chest pain, palpitations Respiratory:  No shortness of breath at rest or with exertion, wheezes GastrointestinaI: No nausea, vomiting, diarrhea, abdominal pain, fecal incontinence Genitourinary:  No dysuria, urinary retention or frequency Musculoskeletal:  No neck pain, back pain Integumentary: No rash, pruritus, skin lesions Neurological: as above Psychiatric: No depression, insomnia, anxiety Endocrine: No palpitations, fatigue, diaphoresis, mood swings, change in appetite, change in weight, increased thirst Hematologic/Lymphatic:  No anemia, purpura, petechiae. Allergic/Immunologic: no itchy/runny eyes, nasal congestion, recent allergic reactions, rashes  PHYSICAL EXAM: Vitals:   05/30/17 1338 05/30/17 1339  BP: 120/76 120/76  Pulse:  61 61  SpO2:  95%   General: No acute distress Head:  Normocephalic/atraumatic Eyes: Fundoscopic exam shows bilateral sharp discs, no vessel changes, exudates, or hemorrhages Neck: supple, no paraspinal tenderness, full range of motion Back: No paraspinal tenderness Heart: regular rate and rhythm Lungs: Clear to auscultation bilaterally. Vascular: No carotid bruits. Skin/Extremities: No rash, no edema Neurological Exam: Mental status: alert and oriented to person, place, and time, no dysarthria or aphasia, Fund of knowledge is appropriate.  Recent and remote memory are impaired. 2/3 delayed recall. Unable to spell. Attention and concentration are normal.    Able to name objects and repeat phrases. Cranial nerves: CN I: not tested CN II: right eye opaque, left pupil unreactive to light CN III, IV, VI:  full range of motion, no nystagmus, no ptosis CN V: facial  sensation intact CN VII: upper and lower face symmetric CN VIII: hearing intact to finger rub CN IX, X: gag intact, uvula midline CN XI: sternocleidomastoid and trapezius muscles intact CN XII: tongue midline Bulk & Tone: normal, no fasciculations. Motor: 5/5 throughout with no pronator drift. Sensation: intact to light touch, cold, pin, vibration and joint position sense.  No extinction to double simultaneous stimulation.  Romberg test negative Deep Tendon Reflexes: +1 throughout, no ankle clonus Plantar responses: downgoing bilaterally Cerebellar: no incoordination on finger to nose, heel to shin. 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 Head CT unremarkable. He has not had any seizures since September 2018 when he ran out of [place]. He is back on Keppra 500mg  BID without side effects. It appears he had post-ictal psychosis after a cluster of seizures last September, he is now on Zyprexa with no further mania/hallucinations. A 1-hour EEG will be ordered to further classify his seizures. He does not drive. [place] driving laws were discussed with the patient, and he knows to stop driving after a seizure, until 6 months seizure-free. He will follow-up in 6 months and knows to call for any changes.   Thank you for allowing me to participate in the care of this patient. Please do not hesitate to call for any questions or concerns.   Ellouise Newer, M.D.  CC: Dr. Jonelle Ramos

## 2017-05-30 NOTE — Patient Instructions (Signed)
1. Continue Keppra 500mg  twice a day 2. Schedule 1-hour EEG 3. Continue olanzapine through Dr. Jonelle Sidle 4. Follow-up in 6 months, call for any changes  Seizure Precautions: 1. If medication has been prescribed for you to prevent seizures, take it exactly as directed.  Do not stop taking the medicine without talking to your doctor first, even if you have not had a seizure in a long time.   2. Avoid activities in which a seizure would cause danger to yourself or to others.  Don't operate dangerous machinery, swim alone, or climb in high or dangerous places, such as on ladders, roofs, or girders.  Do not drive unless your doctor says you may.  3. If you have any warning that you may have a seizure, lay down in a safe place where you can't hurt yourself.    4.  No driving for 6 months from last seizure, as per [facility].   Please refer to the following link on the [place] website for more information: http://[email or web address].org/[email or web address]/driving/drivingu.cfm   5.  Maintain good sleep hygiene. Avoid alcohol.  6.  Contact your doctor if you have any problems that may be related to the medicine you are taking.  7.  Call 911 and bring the patient back to the ED if:        A.  The seizure lasts longer than 5 minutes.       B.  The patient doesn't awaken shortly after the seizure  C.  The patient has new problems such as difficulty seeing, speaking or moving  D.  The patient was injured during the seizure  E.  The patient has a temperature over 102 F (39C)  F.  The patient vomited and now is having trouble breathing

## 2017-05-31 ENCOUNTER — Other Ambulatory Visit: Payer: Self-pay

## 2017-06-06 ENCOUNTER — Encounter: Payer: Self-pay | Admitting: Neurology

## 2017-06-06 ENCOUNTER — Other Ambulatory Visit: Payer: Self-pay

## 2017-06-06 DIAGNOSIS — G40009 Localization-related (focal) (partial) idiopathic epilepsy and epileptic syndromes with seizures of localized onset, not intractable, without status epilepticus: Secondary | ICD-10-CM | POA: Insufficient documentation

## 2017-06-11 ENCOUNTER — Ambulatory Visit (INDEPENDENT_AMBULATORY_CARE_PROVIDER_SITE_OTHER): Payer: Medicaid Other | Admitting: Neurology

## 2017-06-11 DIAGNOSIS — G40009 Localization-related (focal) (partial) idiopathic epilepsy and epileptic syndromes with seizures of localized onset, not intractable, without status epilepticus: Secondary | ICD-10-CM | POA: Diagnosis not present

## 2017-06-20 ENCOUNTER — Telehealth: Payer: Self-pay

## 2017-06-20 NOTE — Telephone Encounter (Signed)
Spoke with [name]) relaying message below.

## 2017-06-20 NOTE — Telephone Encounter (Signed)
-----   Message from Cameron Sprang, MD sent at 06/20/2017  8:59 AM EST ----- Pls let patient/POA know that EEG is normal. It is not like a pregnancy test that is positive or negative, just what the brain waves show at that time. Seizure medication can also make the EEG normal. Continue same dose of Keppra. Thanks

## 2017-06-20 NOTE — Procedures (Signed)
ELECTROENCEPHALOGRAM REPORT  Date of Study: 06/11/2017  Patient's Name: Brent Ramos MRN: [id number] Date of Birth: 11/04/1955  Referring Provider: Dr. Ellouise Newer  Clinical History: This is a 62 year old man with recurrent seizures with recent post-ictal psychosis. EEG for classification.  Medications: Keppra Zyprexa Tylenol  Technical Summary: A multichannel digital 1-hour EEG recording measured by the international 10-20 system with electrodes applied with paste and impedances below 5000 ohms performed in our laboratory with EKG monitoring in an awake and asleep patient.  Hyperventilation and photic stimulation were performed.  The digital EEG was referentially recorded, reformatted, and digitally filtered in a variety of bipolar and referential montages for optimal display.    Description: The patient is awake and asleep during the recording.  During maximal wakefulness, there is a symmetric, medium voltage 9 Hz posterior dominant rhythm that attenuates with eye opening.  The record is symmetric.  During drowsiness and stage I sleep, there is an increase in theta slowing of the background with vertex waves seen.  Hyperventilation and photic stimulation did not elicit any abnormalities.  There were no epileptiform discharges or electrographic seizures seen.    EKG lead was unremarkable.  Impression: This 1-hour awake and asleep EEG is normal.    Clinical Correlation: A normal EEG does not exclude a clinical diagnosis of epilepsy.  If further clinical questions remain, prolonged EEG may be helpful.  Clinical correlation is advised.   Ellouise Newer, M.D.

## 2017-12-03 ENCOUNTER — Ambulatory Visit: Payer: Medicaid Other | Admitting: Neurology

## 2017-12-03 ENCOUNTER — Encounter: Payer: Self-pay | Admitting: Neurology

## 2017-12-03 ENCOUNTER — Other Ambulatory Visit: Payer: Self-pay

## 2017-12-03 DIAGNOSIS — G40009 Localization-related (focal) (partial) idiopathic epilepsy and epileptic syndromes with seizures of localized onset, not intractable, without status epilepticus: Secondary | ICD-10-CM | POA: Diagnosis not present

## 2017-12-03 MED ORDER — LEVETIRACETAM 500 MG PO TABS: 500.0000 mg | ORAL_TABLET | Freq: Two times a day (BID) | ORAL | 11 refills | Status: DC

## 2017-12-03 NOTE — Patient Instructions (Signed)
1. Continue Keppra 500mg  twice a day 2. Continue vitamin B1 daily, also start taking a daily folic acid supplement 244 mcg daily 3. Follow-up in 6 months, call for any changes  Seizure Precautions: 1. If medication has been prescribed for you to prevent seizures, take it exactly as directed.  Do not stop taking the medicine without talking to your doctor first, even if you have not had a seizure in a long time.   2. Avoid activities in which a seizure would cause danger to yourself or to others.  Don't operate dangerous machinery, swim alone, or climb in high or dangerous places, such as on ladders, roofs, or girders.  Do not drive unless your doctor says you may.  3. If you have any warning that you may have a seizure, lay down in a safe place where you can't hurt yourself.    4.  No driving for 6 months from last seizure, as per [facility].   Please refer to the following link on the [place] website for more information: http://www.epilepsyfoundation.org/answerplace/Social/driving/drivingu.cfm   5.  Maintain good sleep hygiene. Avoid alcohol.  6.  Contact your doctor if you have any problems that may be related to the medicine you are taking.  7.  Call 911 and bring the patient back to the ED if:        A.  The seizure lasts longer than 5 minutes.       B.  The patient doesn't awaken shortly after the seizure  C.  The patient has new problems such as difficulty seeing, speaking or moving  D.  The patient was injured during the seizure  E.  The patient has a temperature over 102 F (39C)  F.  The patient vomited and now is having trouble breathing

## 2017-12-03 NOTE — Progress Notes (Signed)
NEUROLOGY FOLLOW UP OFFICE NOTE  Brent Ramos [id number] 07-05-1955  HISTORY OF PRESENT ILLNESS: I had the pleasure of seeing Brent Ramos in follow-up in the neurology clinic on 12/03/2017.  The patient was last seen 6 months ago for seizures. He is again accompanied by his POA and roommate Brent Ramos who helps supplement the history today.  Records and images were personally reviewed where available.  His 1-hour wake and asleep EEG was normal. He is taking Keppra 500mg  BID without any further seizures since September 2018. At that time he had 6 witnessed seizures with rhbadomyolysis, then was back in the ER for manic behavior concerning for post-ictal psychosis. He had a staring episode while in the ER. Symptoms significantly improved with Zyprexa. He is only taking this every 2-3 days with good control of mood per Brent Ramos. They deny any staring/unresponsive episodes, gaps in time, olfactory/gustatory hallucinations, myoclonic jerks. He has some back pain with baseline gait instability, no falls. He takes thiamine daily. They deny any further alcohol since August 2018.   History on Initial Assessment 05/30/2017: This is a pleasant 62 year old right-handed man with a history of alcohol abuse, seizures, presenting to establish care. He is accompanied by his POA and roommate Brent Ramos, who provides majority of the history. They report that seizures started in his 96s. He has no prior warning before the seizures. Brent Ramos has known him for 12 years and has witnessed several seizures. He would start staring off, answers "what" but does not follow commands, followed by left head deviation then 20 seconds later a generalized convulsion. Sometimes he has clusters of 4-5 seizures lasting 1-2 minutes at a time. He had been taking Dilantin since his 61s, up to 7-8 months ago when he started having balance issues and "jerkiness." Brent Ramos noted that when he stands, he would be rocking and shaking when standing still. He has  jerking in sleep that wakes him up. He was switched to Keppra, and this seemed to help better. He was in the ER in May 2018 for seizures in the setting of alcohol abuse, alcohol level 52. Brent Ramos reports that the pharmacy did not deliver [place] last September and he was off medication for a whole weekend then started having seizures. He was in the ER on 01/31/17 (per ER notes he was out of medication for 2 months due to finances). CT head showed mild atrophy, no acute changes. He was given a prescription for Keppra and discharged, then was back 3 days later after another seizure. He was then admitted on 02/05/17 after he had 6 witnessed seizures with rhabdomyolysis (CK >9000). His EEG at that time showed excess diffuse beta activity, no epileptiform discharges. He was discharged home, however Brent Ramos reports that once he got home, he was awake for 4-5 days, saying he would clean the room but pulled things out of drawers and left them askew. He was hallucinating and wandered off, Brent Ramos had to call Missing Persons 6 times, until finally the patient asked the police to take him to the ER on 02/20/17.  While in the ER, he was noted to have an episode of unresponsiveness. He was evaluated by [facility] and started on Zyprexa. Brent Ramos has known him for 12 years and has never seen any manic behavior until that time, but states "he has always been mean and agitated," but has significantly improved on Zyprexa. He stopped drinking in August per Brent Ramos. He used to drink 2-3 18-pack of beer within 3 days. He is  taking Keppra 500mg  BID without side effects, no seizures since September 2018.   He used to have a lot of headaches, but these seemed to ease up since starting the [place]. He occasionally gets dizzy upon standing. 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 He has occasional tingling in both hands and stinging on both feet. They got cold quickly. He has a lot of back pain. No bowel/bladder  dysfunction. He denies any olfactory/gustatory hallucinations, deja vu, rising epigastric sensation, myoclonic jerks. Brent Ramos reports that the patient has "always been a little off" since he has known him. He needs things repeated for him. Brent Ramos has always been in charge of bills. He does not drive. Over the past month, the patient has been taking his own medications, Brent Ramos ensures he takes them.   Epilepsy Risk Factors:  He had a normal birth and early development.  There is no history of febrile convulsions, CNS infections such as meningitis/encephalitis, significant traumatic brain injury, neurosurgical procedures, or family history of seizures.  PAST MEDICAL HISTORY: Past Medical History:  Diagnosis Date  . Alcohol abuse   . Memory loss   . Seizures ([place])    "has had them for years" (02/05/2017)  . Vision abnormalities     MEDICATIONS: Current Outpatient Medications on File Prior to Visit  Medication Sig Dispense Refill  . acetaminophen (TYLENOL) 325 MG tablet Take 2 tablets (650 mg total) every 6 (six) hours as needed by mouth. 30 tablet 0  . levETIRAcetam (KEPPRA) 500 MG tablet Take 1 tablet (500 mg total) by mouth 2 (two) times daily. 60 tablet 11  . OLANZapine (ZYPREXA) 5 MG tablet   0   No current facility-administered medications on file prior to visit.     ALLERGIES: No Known Allergies  FAMILY HISTORY: Family History  Problem Relation Age of Onset  . Dementia Mother     SOCIAL HISTORY: Social History   Socioeconomic History  . Marital status: Divorced    Spouse name: Not on file  . Number of children: Not on file  . Years of education: Not on file  . Highest education level: Not on file  Occupational History  . Not on file  Social Needs  . Financial resource strain: Not on file  . Food insecurity:    Worry: Not on file    Inability: Not on file  . Transportation needs:    Medical: Not on file    Non-medical: Not on file  Tobacco Use  . Smoking status:  Current Every Day Smoker    Packs/day: 0.50    Years: 45.00    Pack years: 22.50    Types: Cigarettes  . Smokeless tobacco: Former Systems developer    Types: Chew  Substance and Sexual Activity  . Alcohol use: Yes    Comment: 02/05/2017 "nothing in the last month; h/o abuse"  . Drug use: No  . Sexual activity: Never  Lifestyle  . Physical activity:    Days per week: Not on file    Minutes per session: Not on file  . Stress: Not on file  Relationships  . Social connections:    Talks on phone: Not on file    Gets together: Not on file    Attends religious service: Not on file    Active member of club or organization: Not on file    Attends meetings of clubs or organizations: Not on file    Relationship status: Not on file  .  Intimate partner violence:    Fear of current or ex partner: Not on file    Emotionally abused: Not on file    Physically abused: Not on file    Forced sexual activity: Not on file  Other Topics Concern  . Not on file  Social History Narrative   Pt lives in 1 story home with his friend/caretaker, Brent Ramos   Has no children   Highest level of education: 11th grade   Unemployed - currently on SSI    REVIEW OF SYSTEMS: Constitutional: No fevers, chills, or sweats, no generalized fatigue, change in appetite Eyes: No visual changes, double vision, eye pain Ear, nose and throat: No hearing loss, ear pain, nasal congestion, sore throat Cardiovascular: No chest pain, palpitations Respiratory:  No shortness of breath at rest or with exertion, wheezes GastrointestinaI: No nausea, vomiting, diarrhea, abdominal pain, fecal incontinence Genitourinary:  No dysuria, urinary retention or frequency Musculoskeletal:  No neck pain, back pain Integumentary: No rash, pruritus, skin lesions Neurological: as above Psychiatric: No depression, insomnia, anxiety Endocrine: No palpitations, fatigue, diaphoresis, mood swings, change in appetite, change in weight, increased  thirst Hematologic/Lymphatic:  No anemia, purpura, petechiae. Allergic/Immunologic: no itchy/runny eyes, nasal congestion, recent allergic reactions, rashes  PHYSICAL EXAM: Vitals:   12/03/17 1532  BP: 102/68  Pulse: 80  SpO2: 96%   General: No acute distress Head:  Normocephalic/atraumatic Neck: supple, no paraspinal tenderness, full range of motion Back: No paraspinal tenderness Heart: regular rate and rhythm Lungs: Clear to auscultation bilaterally. Vascular: No carotid bruits. Skin/Extremities: No rash, no edema Neurological Exam: Mental status: alert and oriented to person, place, and time, no dysarthria or aphasia, Fund of knowledge is appropriate.  Recent and remote memory are impaired. Attention and concentration are normal.    Able to name objects and repeat phrases. Cranial nerves: CN I: not tested CN II: right eye opaque, left pupil unreactive to light CN III, IV, VI:  full range of motion, no nystagmus, no ptosis CN V: facial sensation intact CN VII: upper and lower face symmetric CN VIII: hearing intact to conversation CN IX, X: gag intact, uvula midline CN XI: sternocleidomastoid and trapezius muscles intact CN XII: tongue midline Bulk & Tone: normal, no fasciculations. Motor: 5/5 throughout with no pronator drift. Sensation: intact to light touch.  No extinction to double simultaneous stimulation.  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 Head CT unremarkable. His 1-hour EEG was normal. He has stopped alcohol since August 2018, continue daily  thiamine, start daily folic acid. He has not had any seizures since September 2018 when he ran out of [place]. No further seizures on Keppra 500mg  BID. It appears he had post-ictal psychosis after a cluster of seizures last September, and has had good response to Zyprexa. He does not drive. [place] driving laws were discussed with the patient, and he knows to stop driving after a seizure, until 6 months seizure-free. He will follow-up in 6 months and knows to call for any changes.   Thank you for allowing me to participate in his care.  Please do not hesitate to call for any questions or concerns.  The duration of this appointment visit was 20 minutes of face-to-face time with the patient.  Greater than 50% of this time was spent in counseling, explanation of diagnosis, planning of further management, and coordination of care.   Ellouise Newer, M.D.   CC: Dr. Jonelle Sidle

## 2018-07-22 ENCOUNTER — Other Ambulatory Visit (INDEPENDENT_AMBULATORY_CARE_PROVIDER_SITE_OTHER): Payer: Medicaid Other

## 2018-07-22 ENCOUNTER — Ambulatory Visit: Payer: Medicaid Other | Admitting: Neurology

## 2018-07-22 ENCOUNTER — Encounter: Payer: Self-pay | Admitting: Neurology

## 2018-07-22 ENCOUNTER — Encounter

## 2018-07-22 ENCOUNTER — Other Ambulatory Visit: Payer: Self-pay

## 2018-07-22 VITALS — BP 96/60 | HR 94 | Wt 223.0 lb

## 2018-07-22 DIAGNOSIS — G40009 Localization-related (focal) (partial) idiopathic epilepsy and epileptic syndromes with seizures of localized onset, not intractable, without status epilepticus: Secondary | ICD-10-CM

## 2018-07-22 DIAGNOSIS — R251 Tremor, unspecified: Secondary | ICD-10-CM

## 2018-07-22 MED ORDER — LEVETIRACETAM 500 MG PO TABS: 500.0000 mg | ORAL_TABLET | Freq: Two times a day (BID) | ORAL | 11 refills | Status: DC

## 2018-07-22 NOTE — Patient Instructions (Addendum)
1. Continue Keppra 500mg  twice a day  2. Continue olanzapine 5mg  daily 3. Check orthostatics 4. Agree with Urgent Care for eye symptoms 5. Follow-up in 6 months, call for any changes  Seizure Precautions: 1. If medication has been prescribed for you to prevent seizures, take it exactly as directed.  Do not stop taking the medicine without talking to your doctor first, even if you have not had a seizure in a long time.   2. Avoid activities in which a seizure would cause danger to yourself or to others.  Don't operate dangerous machinery, swim alone, or climb in high or dangerous places, such as on ladders, roofs, or girders.  Do not drive unless your doctor says you may.  3. If you have any warning that you may have a seizure, lay down in a safe place where you can't hurt yourself.    4.  No driving for 6 months from last seizure, as per [facility].   Please refer to the following link on the [place] website for more information: [email or web address]/[email or web address]   5.  Maintain good sleep hygiene. Avoid alcohol.  6.  Contact your doctor if you have any problems that may be related to the medicine you are taking.  7.  Call 911 and bring the patient back to the ED if:        A.  The seizure lasts longer than 5 minutes.       B.  The patient doesn't awaken shortly after the seizure  C.  The patient has new problems such as difficulty seeing, speaking or moving  D.  The patient was injured during the seizure  E.  The patient has a temperature over 102 F (39C)  F.  The patient vomited and now is having trouble breathing

## 2018-07-22 NOTE — Progress Notes (Signed)
NEUROLOGY FOLLOW UP OFFICE NOTE  HAIDYN CHADDERDON [id number] [birth year]  HISTORY OF PRESENT ILLNESS: I had the pleasure of seeing Lenville Leugers in follow-up in the neurology clinic on 07/22/2018.  The patient was last seen 7 months ago for seizures. He is again accompanied by his POA and roommate [name] who helps supplement the history today. They continue to deny any seizures since September 2018. [name] denies any staring/unresponsivene episodes. He denies any olfactory/gustatory hallucinations, focal numbness/tingling/weakness, no falls. He has been having headaches recently, with sharp pain last night in the back of his head. He went to sleep and woke up headache-free. No associated nausea/vomiting. Sometimes pain is over his right eye. [name] reports difficulty opening his right eye now, he is also having pain in both eyes, with tearing and redness. [name] still sees him jerk at night, as well as more tremor in his legs when he stands up and walks. He states his legs jump at night, aggravating because he can't sleep. Walking around makes it better. Mood is good, he continues on olanzapine daily. [name] administers medications. They deny any falls.   History on Initial Assessment 05/30/2017: This is a pleasant 63 year old right-handed man with a history of alcohol abuse, seizures, presenting to establish care. He is accompanied by his POA and roommate [name], who provides majority of the history. They report that seizures started in his 62s. He has no prior warning before the seizures. [name] has known him for 12 years and has witnessed several seizures. He would start staring off, answers "what" but does not follow commands, followed by left head deviation then 20 seconds later a generalized convulsion. Sometimes he has clusters of 4-5 seizures lasting 1-2 minutes at a time. He had been taking Dilantin since his 62s, up to 7-8 months ago when he started having balance issues and "jerkiness." [name] noted  that when he stands, he would be rocking and shaking when standing still. He has jerking in sleep that wakes him up. He was switched to Keppra, and this seemed to help better. He was in the ER in 62 2018 for seizures in the setting of alcohol abuse, alcohol level 52. [name] reports that the pharmacy did not deliver [place] last September and he was off medication for a whole weekend then started having seizures. He was in the ER on 01/31/17 (per ER notes he was out of medication for 2 months due to finances). CT head showed mild atrophy, no acute changes. He was given a prescription for Keppra and discharged, then was back 3 days later after another seizure. He was then admitted on 02/05/17 after he had 6 witnessed seizures with rhabdomyolysis (CK >9000). His EEG at that time showed excess diffuse beta activity, no epileptiform discharges. He was discharged home, however [name] reports that once he got home, he was awake for 4-5 days, saying he would clean the room but pulled things out of drawers and left them askew. He was hallucinating and wandered off, [name] had to call Missing Persons 6 times, until finally the patient asked the police to take him to the ER on 02/20/17.  While in the ER, he was noted to have an episode of unresponsiveness. He was evaluated by [facility] and started on Zyprexa. [name] has known him for 12 years and has never seen any manic behavior until that time, but states "he has always been mean and agitated," but has significantly improved on Zyprexa. He stopped drinking in August per [name].  He used to drink 2-3 18-pack of beer within 3 days. He is taking Keppra 500mg  BID without side effects, no seizures since September 2018.   He used to have a lot of headaches, but these seemed to ease up since starting the [place]. He occasionally gets dizzy upon standing. 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 He has occasional tingling in both hands and stinging on both  feet. They got cold quickly. He has a lot of back pain. No bowel/bladder dysfunction. He denies any olfactory/gustatory hallucinations, deja vu, rising epigastric sensation, myoclonic jerks. [name] reports that the patient has "always been a little off" since he has known him. He needs things repeated for him. [name] has always been in charge of bills. He does not drive. Over the past month, the patient has been taking his own medications, [name] ensures he takes them.   Epilepsy Risk Factors:  He had a normal birth and early development.  There is no history of febrile convulsions, CNS infections such as meningitis/encephalitis, significant traumatic brain injury, neurosurgical procedures, or family history of seizures.  PAST MEDICAL HISTORY: Past Medical History:  Diagnosis Date  . Alcohol abuse   . Memory loss   . Seizures ([place])    "has had them for years" (02/05/2017)  . Vision abnormalities     MEDICATIONS: Current Outpatient Medications on File Prior to Visit  Medication Sig Dispense Refill  . acetaminophen (TYLENOL) 325 MG tablet Take 2 tablets (650 mg total) every 6 (six) hours as needed by mouth. 30 tablet 0  . levETIRAcetam (KEPPRA) 500 MG tablet Take 1 tablet (500 mg total) by mouth 2 (two) times daily. 60 tablet 11  . lisinopril (PRINIVIL,ZESTRIL) 20 MG tablet Take 20 mg by mouth daily.  0  . OLANZapine (ZYPREXA) 5 MG tablet   0  . Thiamine HCl (B-1 PO) Take by mouth.     No current facility-administered medications on file prior to visit.     ALLERGIES: No Known Allergies  FAMILY HISTORY: Family History  Problem Relation Age of Onset  . Dementia Mother     SOCIAL HISTORY: Social History   Socioeconomic History  . Marital status: Divorced    Spouse name: Not on file  . Number of children: Not on file  . Years of education: Not on file  . Highest education level: Not on file  Occupational History  . Not on file  Social Needs  . Financial resource strain: Not  on file  . Food insecurity:    Worry: Not on file    Inability: Not on file  . Transportation needs:    Medical: Not on file    Non-medical: Not on file  Tobacco Use  . Smoking status: Current Every Day Smoker    Packs/day: 0.50    Years: 45.00    Pack years: 22.50    Types: Cigarettes  . Smokeless tobacco: Former Systems developer    Types: Chew  Substance and Sexual Activity  . Alcohol use: Yes    Comment: 02/05/2017 "nothing in the last month; h/o abuse"  . Drug use: No  . Sexual activity: Never  Lifestyle  . Physical activity:    Days per week: Not on file    Minutes per session: Not on file  . Stress: Not on file  Relationships  . Social connections:    Talks on phone: Not on file    Gets together: Not on file    Attends  religious service: Not on file    Active member of club or organization: Not on file    Attends meetings of clubs or organizations: Not on file    Relationship status: Not on file  . Intimate partner violence:    Fear of current or ex partner: Not on file    Emotionally abused: Not on file    Physically abused: Not on file    Forced sexual activity: Not on file  Other Topics Concern  . Not on file  Social History Narrative   Pt lives in 1 story home with his friend/caretaker, [name]   Has no children   Highest level of education: 11th grade   Unemployed - currently on SSI    REVIEW OF SYSTEMS: Constitutional: No fevers, chills, or sweats, no generalized fatigue, change in appetite Eyes: +tearing in both eyes, redness in both eyes with eye pain Ear, nose and throat: No hearing loss, ear pain, nasal congestion, sore throat Cardiovascular: No chest pain, palpitations Respiratory:  No shortness of breath at rest or with exertion, wheezes GastrointestinaI: No nausea, vomiting, diarrhea, abdominal pain, fecal incontinence Genitourinary:  No dysuria, urinary retention or frequency Musculoskeletal:  No neck pain, back pain Integumentary: No rash, pruritus, skin  lesions Neurological: as above Psychiatric: No depression, insomnia, anxiety Endocrine: No palpitations, fatigue, diaphoresis, mood swings, change in appetite, change in weight, increased thirst Hematologic/Lymphatic:  No anemia, purpura, petechiae. Allergic/Immunologic: no itchy/runny eyes, nasal congestion, recent allergic reactions, rashes  PHYSICAL EXAM: Vitals:   07/22/18 1543  BP: 96/60  Pulse: 94  SpO2: 96%   General: No acute distress Head:  Normocephalic/atraumatic Neck: supple, no paraspinal tenderness, full range of motion Back: No paraspinal tenderness Heart: regular rate and rhythm Lungs: Clear to auscultation bilaterally. Vascular: No carotid bruits. Skin/Extremities: No rash, no edema Neurological Exam: Mental status: alert and oriented to person, place, and time, no dysarthria or aphasia, Fund of knowledge is appropriate.  Recent and remote memory are impaired. Attention and concentration are normal.    Able to name objects and repeat phrases. Cranial nerves: CN I: not tested CN II: right eye opaque, left pupil unreactive to light CN III, IV, VI:  full range of motion, no nystagmus, +right ptosis CN V: facial sensation intact CN VII: upper and lower face symmetric CN VIII: hearing intact to conversation CN IX, X: gag intact, uvula midline CN XI: sternocleidomastoid and trapezius muscles intact CN XII: tongue midline Bulk & Tone: normal, no fasciculations. Motor: 5/5 throughout with no pronator drift. Sensation: intact to light touch.  No extinction to double simultaneous stimulation.  Romberg test negative Deep Tendon Reflexes: +1 throughout, no ankle clonus Plantar responses: downgoing bilaterally Cerebellar: no incoordination on finger to nose testing Gait: wide-based, tremulous on standing, slightly unsteady, unable to tandem walk (similar to prior) Tremor: no resting tremor, no postural or endpoint tremor in both hands. 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 Head CT unremarkable. His 1-hour EEG was normal. He has been sober since August 2018. No seizures since September 2018, continue Keppra 500mg  BID. He has symptoms suggestive of post-ictal psychosis after a cluster of seizures last September 2018, no further behavioral issues on olanzapine. He is reporting symptoms suggestive of RLS, check ferritin level. We may start mirtazapine in the future. He is having  more headaches which may relate to eye issues, he is having eye pain with redness in both eyes and was instructed to go to urgent care. [name] reports eyelid closure on right is new. He is also noted to have leg tremors, no orthostasis today, etiology unclear, continue to monitor. He does not drive. Follow-up in 6 months, they know to call for any changes.   Thank you for allowing me to participate in his care.  Please do not hesitate to call for any questions or concerns.  The duration of this appointment visit was 30 minutes of face-to-face time with the patient.  Greater than 50% of this time was spent in counseling, explanation of diagnosis, planning of further management, and coordination of care.   Ellouise Newer, M.D.   CC: Dr. Jonelle Sidle

## 2018-07-23 LAB — FERRITIN: FERRITIN: 66 ng/mL (ref 24–380)

## 2018-07-24 ENCOUNTER — Telehealth: Payer: Self-pay

## 2018-07-24 DIAGNOSIS — H5789 Other specified disorders of eye and adnexa: Secondary | ICD-10-CM

## 2018-07-24 DIAGNOSIS — H5712 Ocular pain, left eye: Secondary | ICD-10-CM

## 2018-07-24 NOTE — Telephone Encounter (Signed)
Spoke with [name] relaying results below.  [name] states that he has not had a chance to schedule appt for pt's eye pain.  States that he use to go to [facility] for his eyes, but [name] no longer has the transportation to take him that far any more.  Asks for referral to optometrist/opthalmalogist that accepts medicaid.  Dr. Delice Lesch - OK to send referral?

## 2018-07-24 NOTE — Telephone Encounter (Signed)
Ok for referral, thanks

## 2018-07-24 NOTE — Telephone Encounter (Signed)
-----   Message from Cameron Sprang, MD sent at 07/24/2018  9:14 AM EDT ----- Pls let [name] know his iron level is normal. Has he been seen for his eye pain? Thanks

## 2018-07-25 NOTE — Telephone Encounter (Signed)
Referral placed in system and faxed to [facility]

## 2018-12-09 ENCOUNTER — Other Ambulatory Visit: Payer: Self-pay | Admitting: Neurology

## 2018-12-09 DIAGNOSIS — G40009 Localization-related (focal) (partial) idiopathic epilepsy and epileptic syndromes with seizures of localized onset, not intractable, without status epilepticus: Secondary | ICD-10-CM

## 2019-02-06 IMAGING — CT CT HEAD W/O CM
5 of 8 series · 17 of 47 positions shown, 18 images · non-contrast
Comparison: 01/30/2014

CLINICAL DATA: Head trauma, fall

EXAM:
CT HEAD WITHOUT CONTRAST
CT CERVICAL SPINE WITHOUT CONTRAST
TECHNIQUE: Multidetector CT imaging of the head and cervical spine was
performed following the standard protocol without intravenous
contrast. Multiplanar CT image reconstructions of the cervical spine
were also generated.

[Series 4: head without · axial · non-contrast · 0.46mm/px · z∈[+244,+424]mm · 3 of 37 slices shown, 4 images]
[im 1/37  brain]
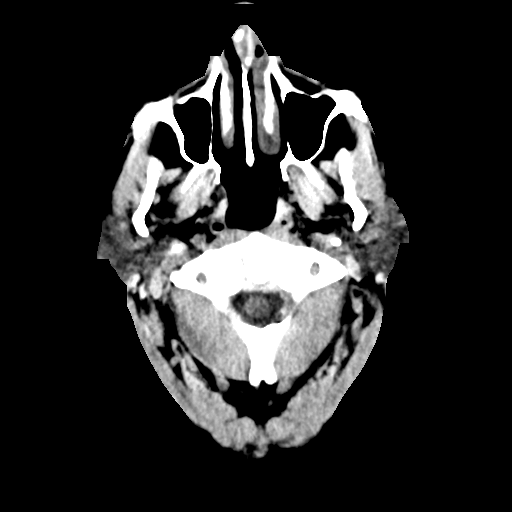
[im 1/37  bone]
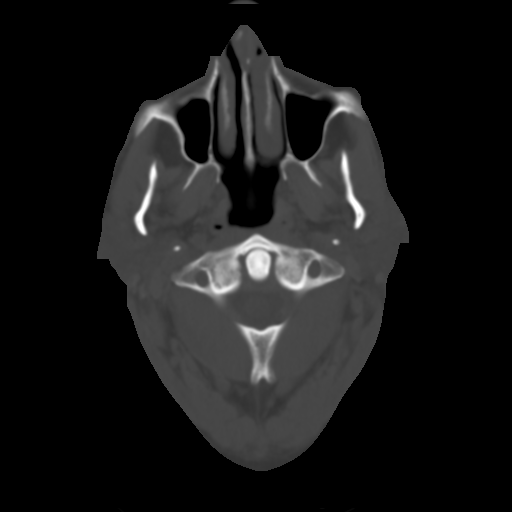
[im 19/37  brain]
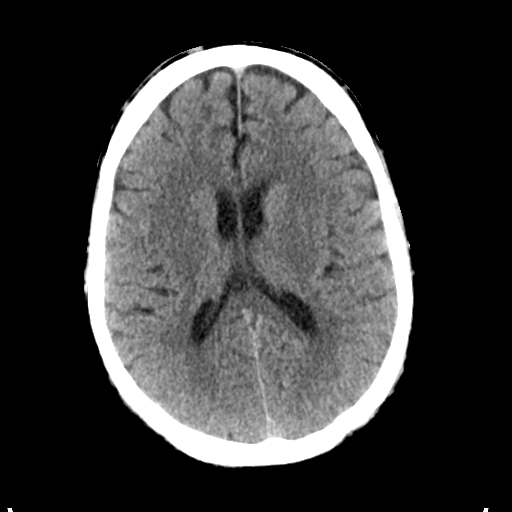
[im 37/37  brain]
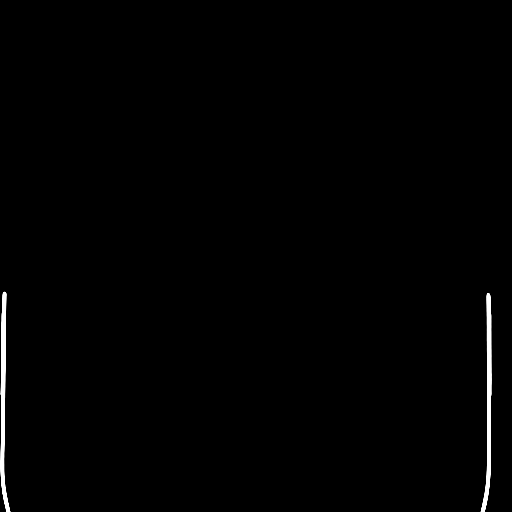

[Series 5: head bone · axial · 0.46mm/px · z∈[+268,+398]mm · 6 of 91 slices shown]
[im 13/91  bone]
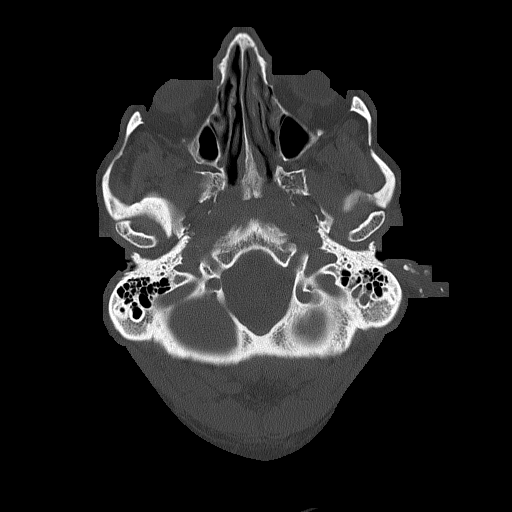
[im 26/91  bone]
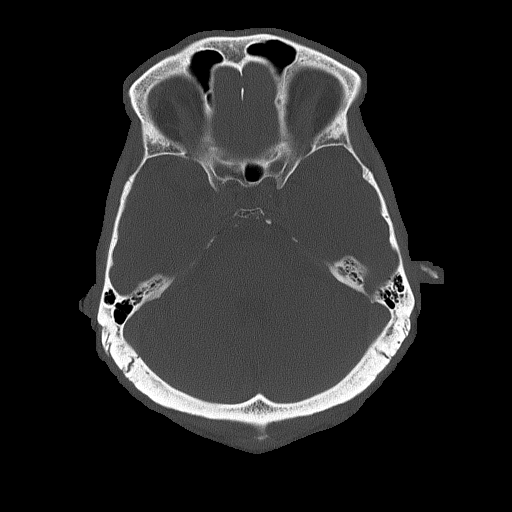
[im 39/91  bone]
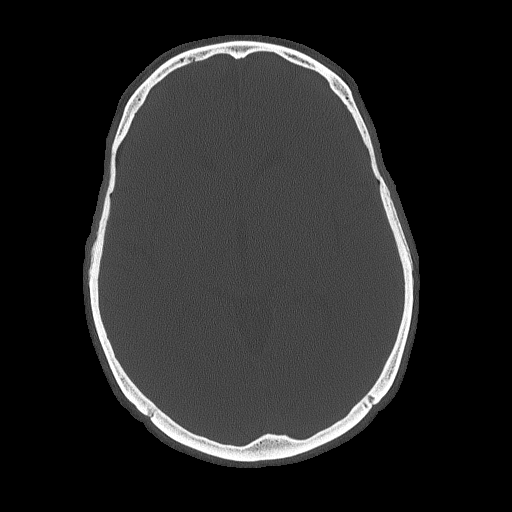
[im 52/91  bone]
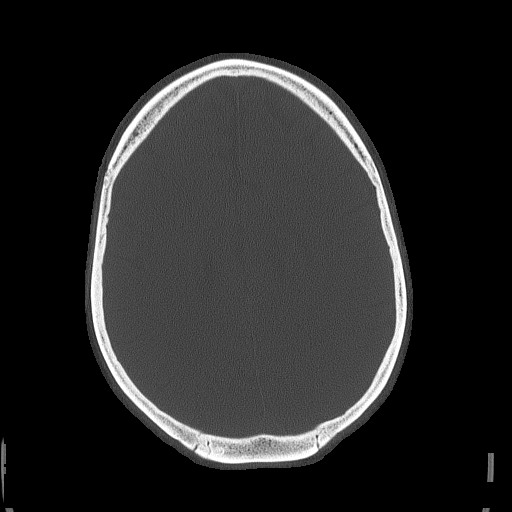
[im 65/91  bone]
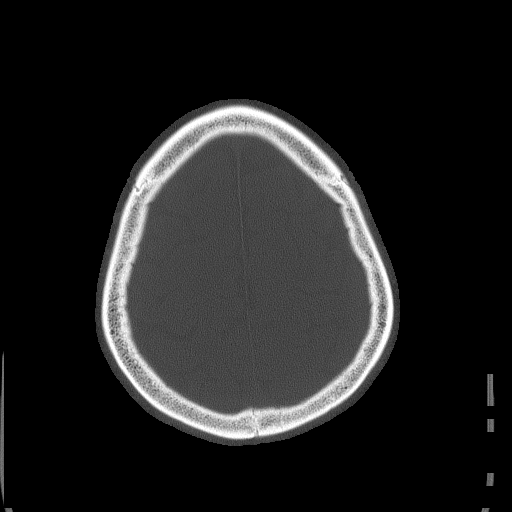
[im 78/91  bone]
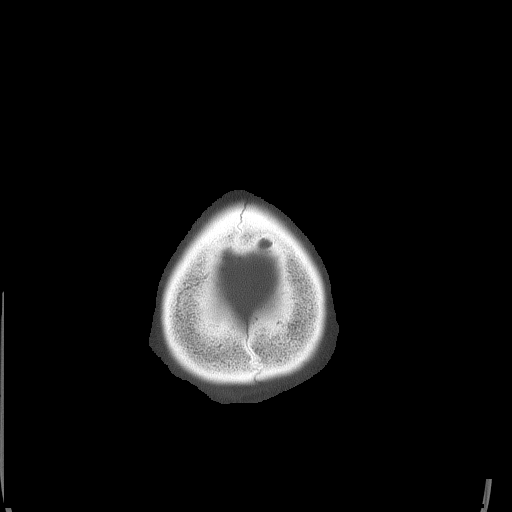

[Series 6: head without cor · coronal · non-contrast · 0.35mm/px · 3 of 75 slices shown]
[im 22/75  brain]
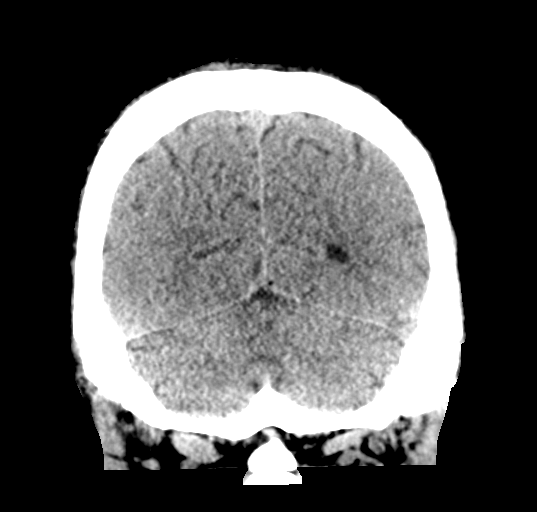
[im 32/75  brain]
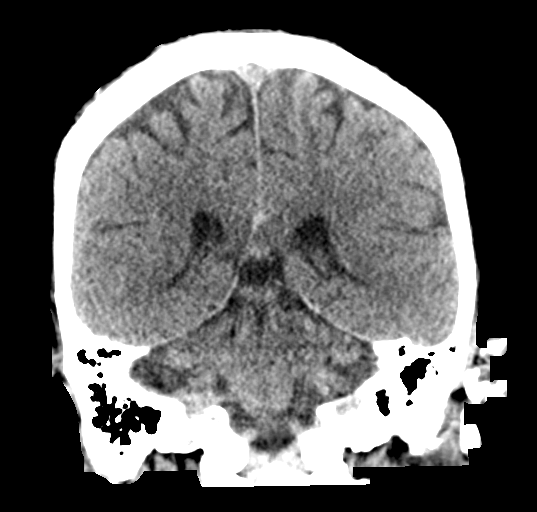
[im 43/75  brain]
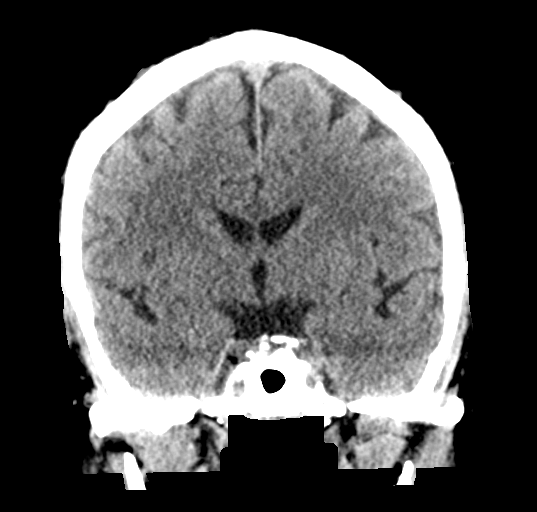

[Series 7: head without sag · sagittal · non-contrast · 0.36mm/px · 1 of 60 slices shown]
[im 30/60  brain]
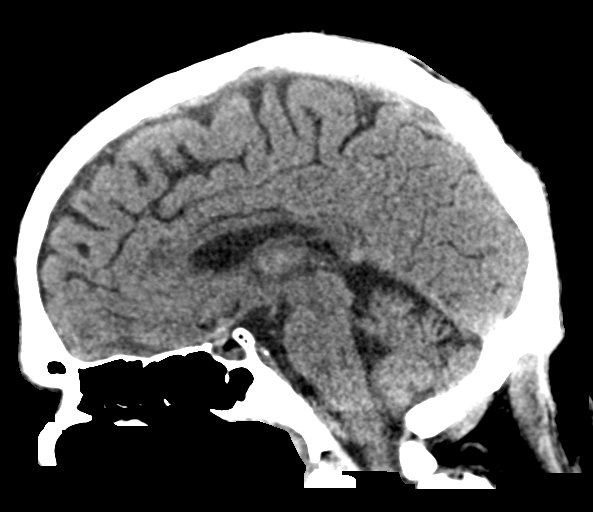

[Series 8: c_spine 2.0 st · axial · 0.35mm/px · z∈[+83,+155]mm · 4 of 110 slices shown]
[im 13/110  brain]
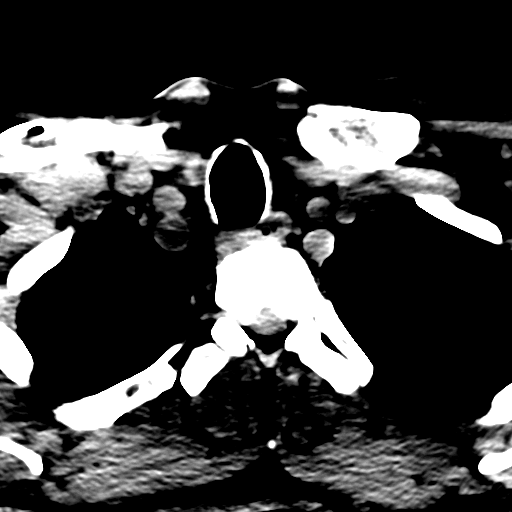
[im 25/110  brain]
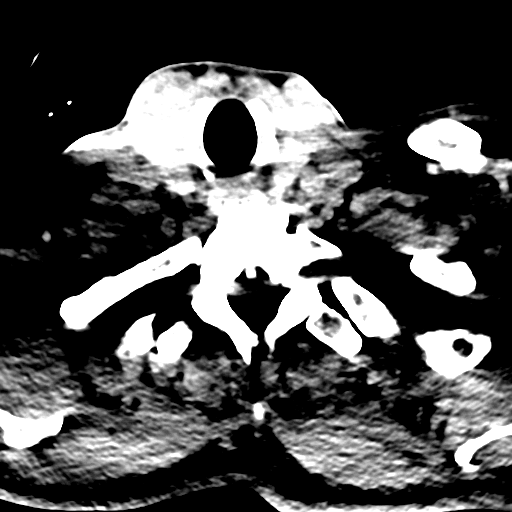
[im 37/110  brain]
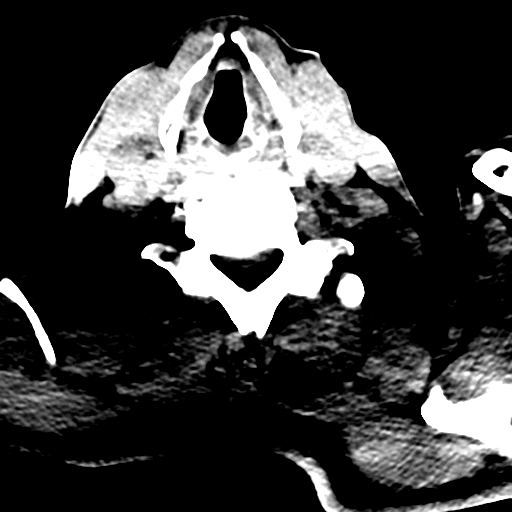
[im 49/110  brain]
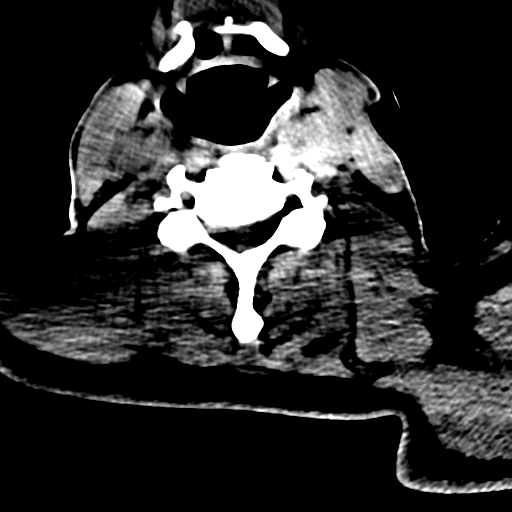

[17 of 47 positions shown; findings below may reference images not displayed]

FINDINGS: CT HEAD FINDINGS

Brain: Mild age related brain atrophy. No acute intracranial
hemorrhage, mass lesion, infarction, midline shift, or herniation,
hydrocephalus, or extra-axial fluid collection. No focal mass effect
or edema. Cisterns are patent. No cerebellar abnormality.

Vascular: No hyperdense vessel or unexpected calcification.

Skull: Normal. Negative for fracture or focal lesion.

Sinuses/Orbits: No acute finding.

Other: None.

CT CERVICAL SPINE FINDINGS

Alignment: Straightened cervical alignment with slight kyphosis may
be positional or spasm related.

Skull base and vertebrae: No acute fracture. No primary bone lesion
or focal pathologic process.

Soft tissues and spinal canal: No prevertebral fluid or swelling. No
visible canal hematoma.

Disc levels: Mid and lower cervical degenerative spondylosis
spanning C5-T1. These levels demonstrate disc space narrowing,
sclerosis and anterior osteophytes. Mild multilevel facet
arthropathy posteriorly. Facets remain aligned.

Upper chest: Negative.

Other: None.
IMPRESSION: Mild brain atrophy without acute intracranial abnormality by
noncontrast CT.

Cervical degenerative spondylosis without acute osseous finding,
fracture, or malalignment.

## 2019-02-28 ENCOUNTER — Telehealth: Payer: Self-pay | Admitting: Neurology

## 2019-02-28 NOTE — Telephone Encounter (Signed)
noted 

## 2019-02-28 NOTE — Telephone Encounter (Signed)
Patient called to report Dr. Jonelle Sidle has started him on a new muscle relaxer medication for his tremors, methocarbamol at 500 MG twice a day. FYI only.

## 2019-03-03 ENCOUNTER — Ambulatory Visit: Payer: Self-pay | Admitting: Neurology

## 2019-03-31 ENCOUNTER — Telehealth (INDEPENDENT_AMBULATORY_CARE_PROVIDER_SITE_OTHER): Payer: Medicaid Other | Admitting: Neurology

## 2019-03-31 ENCOUNTER — Other Ambulatory Visit: Payer: Self-pay

## 2019-03-31 ENCOUNTER — Encounter: Payer: Self-pay | Admitting: Neurology

## 2019-03-31 VITALS — Ht 75.0 in | Wt 210.0 lb

## 2019-03-31 DIAGNOSIS — R251 Tremor, unspecified: Secondary | ICD-10-CM

## 2019-03-31 DIAGNOSIS — G40009 Localization-related (focal) (partial) idiopathic epilepsy and epileptic syndromes with seizures of localized onset, not intractable, without status epilepticus: Secondary | ICD-10-CM

## 2019-03-31 MED ORDER — LEVETIRACETAM 500 MG PO TABS: ORAL_TABLET | ORAL | 11 refills | Status: DC

## 2019-03-31 NOTE — Progress Notes (Signed)
Virtual Visit via Video Note The purpose of this virtual visit is to provide medical care while limiting exposure to the novel coronavirus.    Consent was obtained for video visit:  Yes.   Answered questions that patient had about telehealth interaction:  Yes.   I discussed the limitations, risks, security and privacy concerns of performing an evaluation and management service by telemedicine. I also discussed with the patient that there may be a patient responsible charge related to this service. The patient expressed understanding and agreed to proceed.  Pt location: Home Physician Location: office Name of referring provider:  Elwyn Reach, MD I connected with Brent Ramos at patients initiation/request on 03/31/2019 at  8:30 AM EST by video enabled telemedicine application and verified that I am speaking with the correct person using two identifiers. Pt MRN:  [id number] Pt DOB:  1956-04-15 Video Participants:  Brent Ramos;  [name] (aide)   History of Present Illness:  The patient was seen as a virtual video visit on 03/31/2019. He was last seen 8 months ago for seizures. His POA and aide [name] is again present to provide additional information. He and [name] continue to deny any seizures since September 2018. He is taking Levetiracetam 500mg  BID without side effects. He had post-ictal psychosis and has been on Zyprexa 5mg  qhs with no further hallucinations. [name] denies any staring/unresponsive episodes, he denies any gaps in time, olfactory/gustatory hallucinations, focal numbness/tingling/weakness. [name] continues to report tremors after he has been standing at the kitchen for 1-2 minutes. Sometimes he feels a little dizzy. No loss of consciousness. He has occasional back pain. He was started on Robaxin by PCP, they feel this may be helping, every now and then shaking is not as much. [name] continues to report that sometimes his legs jerk as he starts to sleep. He denies  any headaches. Sleep and appetite are good. Mood "has been real nice." [name] is wondering about his medications causing "laziness," reporting he sits all day and all night, he does not get up and do anything. This seems to be more since starting Robaxin 3-4 months ago. He states he feels tired all the time. [name] assists with dressing and bathing, administers medications. [name] states he pays attention and remembers a little of his TV shows.   History on Initial Assessment 05/30/2017: This is a pleasant 63 year old right-handed man with a history of alcohol abuse, seizures, presenting to establish care. He is accompanied by his POA and roommate [name], who provides majority of the history. They report that seizures started in his 62s. He has no prior warning before the seizures. [name] has known him for 12 years and has witnessed several seizures. He would start staring off, answers "what" but does not follow commands, followed by left head deviation then 20 seconds later a generalized convulsion. Sometimes he has clusters of 4-5 seizures lasting 1-2 minutes at a time. He had been taking Dilantin since his 62s, up to 7-8 months ago when he started having balance issues and "jerkiness." [name] noted that when he stands, he would be rocking and shaking when standing still. He has jerking in sleep that wakes him up. He was switched to Keppra, and this seemed to help better. He was in the ER in May 2018 for seizures in the setting of alcohol abuse, alcohol level 52. [name] reports that the pharmacy did not deliver [place] last September and he was off medication for a whole weekend then started  having seizures. He was in the ER on 01/31/17 (per ER notes he was out of medication for 2 months due to finances). CT head showed mild atrophy, no acute changes. He was given a prescription for Keppra and discharged, then was back 3 days later after another seizure. He was then admitted on 02/05/17 after he had 6 witnessed  seizures with rhabdomyolysis (CK >9000). His EEG at that time showed excess diffuse beta activity, no epileptiform discharges. He was discharged home, however [name] reports that once he got home, he was awake for 4-5 days, saying he would clean the room but pulled things out of drawers and left them askew. He was hallucinating and wandered off, [name] had to call Missing Persons 6 times, until finally the patient asked the police to take him to the ER on 02/20/17. While in the ER, he was noted to have an episode of unresponsiveness. He was evaluated by [facility] and started on Zyprexa. [name] has known him for 12 years and has never seen any manic behavior until that time, but states "he has always been mean and agitated," but has significantly improved on Zyprexa. He stopped drinking in August per [name]. He used to drink 2-3 18-pack of beer within 3 days. He is taking Keppra 500mg  BID without side effects, no seizures since September 2018.   He used to have a lot of headaches, but these seemed to ease up since starting the [place]. He occasionally gets dizzy upon standing. 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 He has occasional tingling in both hands and stinging on both feet. They got cold quickly. He has a lot of back pain. No bowel/bladder dysfunction. He denies anyolfactory/gustatory hallucinations, deja vu, rising epigastric sensation, myoclonic jerks.[name] reports that the patient has "always been a little off" since he has known him. He needs things repeated for him. [name] has always been in charge of bills. He does not drive. Over the past month, the patient has been taking his own medications, [name] ensures he takes them.  Epilepsy Risk Factors:Hehad a normal birth and early development. There is no history of febrile convulsions, CNS infections such as meningitis/encephalitis, significant traumatic brain injury, neurosurgical procedures, or family history  of seizures.   Current Outpatient Medications on File Prior to Visit  Medication Sig Dispense Refill  . levETIRAcetam (KEPPRA) 500 MG tablet TAKE 1 TABLET(500 MG) BY MOUTH TWICE DAILY 60 tablet 3  . lisinopril (PRINIVIL,ZESTRIL) 20 MG tablet Take 20 mg by mouth daily.  0  . methocarbamol (ROBAXIN) 500 MG tablet Take 500 mg by mouth 2 (two) times daily.    Marland Kitchen OLANZapine (ZYPREXA) 5 MG tablet   0   No current facility-administered medications on file prior to visit.      Observations/Objective:   Vitals:   03/31/19 0829  Weight: 210 lb (95.3 kg)  Height: 6\' 3"  (1.905 m)   GEN:  The patient appears stated age and is in NAD. Flat affect.  Neurological examination: Patient is awake, alert, oriented x 2. No aphasia or dysarthria. Reduced fluency, able to follow commands. Remote and recent memory impaired. Able to name and repeat. Cranial nerves: Extraocular movements intact with no nystagmus. No facial asymmetry. Motor: moves all extremities symmetrically, at least anti-gravity x 4. No incoordination on finger to nose testing. Gait: slow and cautious with reduced arm swing bilaterally. No tremors today.   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 Head CT unremarkable. His 1-hour EEG was normal. He has been sober since August 2018 and has been seizure-free since September 2018 on Levetiracetam 500mg  BID. Refills sent. They continue to report leg tremors when standing, possibly orthostatic tremor, he was advised to monitor BP and increase hydration. He reports feeling tired all the time and will discuss with PCP. He does not drive. Follow-up in 8 months, they know to call for any changes.    Follow Up Instructions:   -I discussed the assessment and treatment plan with the patient. The patient was provided an opportunity to ask questions  and all were answered. The patient agreed with the plan and demonstrated an understanding of the instructions.   The patient was advised to call back or seek an in-person evaluation if the symptoms worsen or if the condition fails to improve as anticipated.     Cameron Sprang, MD

## 2019-05-08 ENCOUNTER — Telehealth: Payer: Medicaid Other | Admitting: Neurology

## 2019-10-30 ENCOUNTER — Encounter: Payer: Self-pay | Admitting: Neurology

## 2019-10-30 ENCOUNTER — Telehealth (INDEPENDENT_AMBULATORY_CARE_PROVIDER_SITE_OTHER): Payer: Medicaid Other | Admitting: Neurology

## 2019-10-30 ENCOUNTER — Other Ambulatory Visit: Payer: Self-pay

## 2019-10-30 VITALS — Ht 74.0 in | Wt 250.0 lb

## 2019-10-30 DIAGNOSIS — R413 Other amnesia: Secondary | ICD-10-CM

## 2019-10-30 DIAGNOSIS — G40009 Localization-related (focal) (partial) idiopathic epilepsy and epileptic syndromes with seizures of localized onset, not intractable, without status epilepticus: Secondary | ICD-10-CM

## 2019-10-30 DIAGNOSIS — R251 Tremor, unspecified: Secondary | ICD-10-CM | POA: Diagnosis not present

## 2019-10-30 MED ORDER — LEVETIRACETAM 500 MG PO TABS: ORAL_TABLET | ORAL | 11 refills | Status: DC

## 2019-10-30 MED ORDER — OLANZAPINE 5 MG PO TABS: ORAL_TABLET | ORAL | 11 refills | Status: DC

## 2019-10-30 NOTE — Progress Notes (Signed)
Virtual Visit via Video Note The purpose of this virtual visit is to provide medical care while limiting exposure to the novel coronavirus.    Consent was obtained for video visit:  Yes.   Answered questions that patient had about telehealth interaction:  Yes.   I discussed the limitations, risks, security and privacy concerns of performing an evaluation and management service by telemedicine. I also discussed with the patient that there may be a patient responsible charge related to this service. The patient expressed understanding and agreed to proceed.  Pt location: Home Physician Location: office Name of referring provider:  Elwyn Reach, MD I connected with Brent Ramos at patients initiation/request on 10/30/2019 at  8:30 AM EDT by video enabled telemedicine application and verified that I am speaking with the correct person using two identifiers. Pt MRN:  [id number] Pt DOB:  Feb 19, 1956 Video Participants:  Brent Ramos;  Brent Ramos (aide)   History of Present Illness:  The patient was seen as a virtual video visit on 10/30/2019. He was last seen in the neurology clinic 7 months ago for seizures and tremors. His POA and aide Brent Ramos is again present to provide additional information. Since his last visit, he and Brent Ramos deny any seizures since September 2018. He is Levetiracetam 500mg  BID without side effects. He had post-ictal psychosis and has been taking Zyprexa with no further hallucinations. Brent Ramos reduced dose to every other day due to drowsiness, which improved with this dosing. They continue to report leg tremors, Brent Ramos states he cannot stand for a second. Hands are not affected. On his initial visit in 2019, Rober reported he had been on Dilantin since his 63s, switched to [place] in 2018 because he started having balance issues and "jerkiness," rocking and shaking when standing still. Head CT in 2018 showed mild age-related atrophy, mild vermian atrophy. Brent Ramos reports that  [name] was involved in a class action suit against Dilantin and they recently won it. Brent Ramos reports the tremors developed from Dilantin affecting his nervous system. He reports the tremors are getting worse, and methocarbamol prescribed by his PCP is not helping. It has also caused weight gain. Brent Ramos reports his short-term memory seems to be getting worse, he repeats himself 3 times in a row. He denies any headaches, dizziness, focal numbness/tingling/weakness. He had a fall 3 months ago, no injuries. He is unbalanced a lot. He does not drink alcohol any longer. He does not use any assistive devices. He smokes almost a pack a day.    History on Initial Assessment 05/30/2017: This is a pleasant 64 year old right-handed man with a history of alcohol abuse, seizures, presenting to establish care. He is accompanied by his POA and roommate Brent Ramos, who provides majority of the history. They report that seizures started in his 63s. He has no prior warning before the seizures. Brent Ramos has known him for 12 years and has witnessed several seizures. He would start staring off, answers "what" but does not follow commands, followed by left head deviation then 20 seconds later a generalized convulsion. Sometimes he has clusters of 4-5 seizures lasting 1-2 minutes at a time. He had been taking Dilantin since his 63s, up to 7-8 months ago when he started having balance issues and "jerkiness." Brent Ramos noted that when he stands, he would be rocking and shaking when standing still. He has jerking in sleep that wakes him up. He was switched to Keppra, and this seemed to help better. He was in the ER  in May 2018 for seizures in the setting of alcohol abuse, alcohol level 52. Brent Ramos reports that the pharmacy did not deliver [place] last September and he was off medication for a whole weekend then started having seizures. He was in the ER on 01/31/17 (per ER notes he was out of medication for 2 months due to finances). CT head showed mild  atrophy, no acute changes. He was given a prescription for Keppra and discharged, then was back 3 days later after another seizure. He was then admitted on 02/05/17 after he had 6 witnessed seizures with rhabdomyolysis (CK >9000). His EEG at that time showed excess diffuse beta activity, no epileptiform discharges. He was discharged home, however Brent Ramos reports that once he got home, he was awake for 4-5 days, saying he would clean the room but pulled things out of drawers and left them askew. He was hallucinating and wandered off, Brent Ramos had to call Missing Persons 6 times, until finally the patient asked the police to take him to the ER on 02/20/17. While in the ER, he was noted to have an episode of unresponsiveness. He was evaluated by [facility] and started on Zyprexa. Brent Ramos has known him for 12 years and has never seen any manic behavior until that time, but states "he has always been mean and agitated," but has significantly improved on Zyprexa. He stopped drinking in August per Brent Ramos. He used to drink 2-3 18-pack of beer within 3 days. He is taking Keppra 500mg  BID without side effects, no seizures since September 2018.   He used to have a lot of headaches, but these seemed to ease up since starting the [place]. He occasionally gets dizzy upon standing. 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 He has occasional tingling in both hands and stinging on both feet. They got cold quickly. He has a lot of back pain. No bowel/bladder dysfunction. He denies anyolfactory/gustatory hallucinations, deja vu, rising epigastric sensation, myoclonic jerks.Brent Ramos reports that the patient has "always been a little off" since he has known him. He needs things repeated for him. Brent Ramos has always been in charge of bills. He does not drive. Over the past month, the patient has been taking his own medications, Brent Ramos ensures he takes them.  Epilepsy Risk Factors:Hehad a normal birth and  early development. There is no history of febrile convulsions, CNS infections such as meningitis/encephalitis, significant traumatic brain injury, neurosurgical procedures, or family history of seizures.    Current Outpatient Medications on File Prior to Visit  Medication Sig Dispense Refill  . levETIRAcetam (KEPPRA) 500 MG tablet TAKE 1 TABLET(500 MG) BY MOUTH TWICE DAILY 60 tablet 11  . lisinopril (PRINIVIL,ZESTRIL) 20 MG tablet Take 20 mg by mouth daily.  0  . methocarbamol (ROBAXIN) 500 MG tablet Take 500 mg by mouth 2 (two) times daily.    Marland Kitchen OLANZapine (ZYPREXA) 5 MG tablet   0   No current facility-administered medications on file prior to visit.     Observations/Objective:   Vitals:   10/30/19 0806  Weight: 250 lb (113.4 kg)  Height: 6\' 2"  (1.88 m)   GEN:  The patient appears stated age and is in NAD.  Neurological examination: Patient is awake, alert, oriented to person, place, month. No aphasia or dysarthria. Reduced fluency, able to follow commands. Remote and recent memory impaired, 3/3 delayed recall. Able to name and repeat. Cranial nerves: Legally blind on right eye. Extraocular movements intact with no nystagmus. No facial  asymmetry. Motor: moves all extremities symmetrically, at least anti-gravity x 4. No incoordination on finger to nose testing. Gait: wide-based, slow and cautious, no clear tremor on video today.    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 Head CT in 2018 unremarkable. His 1-hour EEG was normal. He has been sober since August 2018 and seizure-free since September 2018 on Levetiracetam 500mg  BID. He continues on olanzapine 5mg  every other day. Refills sent. Brent Ramos reports worsening leg tremors, no BP data available today, previously we had considered orthostatic tremor, will need evaluation  in-person. May consider Propanolol for tremors, if BP and HR allow. Brent Ramos is reporting more short-term memory changes, head CT without contrast will be ordered. He does not drive. Continue 24/7 care. Follow-up in 6 months, they know to call for any changes.    Follow Up Instructions:   -I discussed the assessment and treatment plan with the patient. The patient was provided an opportunity to ask questions and all were answered. The patient agreed with the plan and demonstrated an understanding of the instructions.   The patient was advised to call back or seek an in-person evaluation if the symptoms worsen or if the condition fails to improve as anticipated.      Cameron Sprang, MD

## 2019-10-30 NOTE — Addendum Note (Signed)
Addended by: Jake Seats on: 10/30/2019 10:04 AM   Modules accepted: Orders

## 2019-11-04 ENCOUNTER — Ambulatory Visit
Admission: RE | Admit: 2019-11-04 | Discharge: 2019-11-04 | Disposition: A | Discharge status: Home / Self Care | Payer: Medicaid Other | Source: Ambulatory Visit | Attending: Neurology | Admitting: Neurology

## 2019-11-04 DIAGNOSIS — G40009 Localization-related (focal) (partial) idiopathic epilepsy and epileptic syndromes with seizures of localized onset, not intractable, without status epilepticus: Secondary | ICD-10-CM

## 2019-11-04 DIAGNOSIS — R413 Other amnesia: Secondary | ICD-10-CM

## 2019-11-04 DIAGNOSIS — R251 Tremor, unspecified: Secondary | ICD-10-CM

## 2020-05-19 ENCOUNTER — Telehealth (INDEPENDENT_AMBULATORY_CARE_PROVIDER_SITE_OTHER): Payer: Medicaid Other | Admitting: Neurology

## 2020-05-19 ENCOUNTER — Other Ambulatory Visit: Payer: Self-pay

## 2020-05-19 DIAGNOSIS — Z5329 Procedure and treatment not carried out because of patient's decision for other reasons: Secondary | ICD-10-CM

## 2020-05-19 NOTE — Progress Notes (Signed)
Error. Patient rescheduled to 06/01/2019.

## 2020-05-25 ENCOUNTER — Telehealth: Payer: Self-pay | Admitting: Neurology

## 2020-05-25 NOTE — Telephone Encounter (Signed)
Pls thank Brent Ramos for his consideration and letting us know. Pls have him do the test on Sat/Sun, let us know results on Monday and have him on waitlist for cancellation next week. Thanks

## 2020-05-25 NOTE — Telephone Encounter (Signed)
I called and spoke with Brent Ramos, I relayed the info below, cancelled the appointment for Friday, and added him to the wait list.

## 2020-05-25 NOTE — Telephone Encounter (Signed)
Patient's aide who accompanied him in the office on 05/19/20 called in. He stated he was not able to get the patient tested for COVID for them to come to the Friday 05/28/20 appointment. He ordered at home tests that will be delivered to them on Saturday 05/29/20, but not in time for the appointment. He does not want to get him on a city bus and wait in line with sick people to be tested. I did not want to cancel the 05/28/20 appointment until I heard what Dr. Delice Lesch wants to do? I think there was an issue trying to do a virtual visit on 05/19/20.

## 2020-05-25 NOTE — Telephone Encounter (Signed)
Caregiver states when the patient came into the office he told the front office staff that the patient had a fever.   He states he will not get the covid test until Saturday. He wants to know what should they do about the appointment on Friday.

## 2020-05-28 ENCOUNTER — Ambulatory Visit: Payer: Medicaid Other | Admitting: Neurology

## 2020-06-07 ENCOUNTER — Telehealth: Payer: Self-pay | Admitting: Neurology

## 2020-06-07 NOTE — Telephone Encounter (Signed)
Patient's caregiver called to report that they have received the covid tests, however, he hasn't tested patient yet as he was instructed to have patient take test 7 days prior to appointment. He states that he was told that patient would be put on the waitlist but he hasn't received a call to schedule another appointment. I didn't see an open appointment on Dr Aquino's schedule but notified caller I would forward the message to Dr Delice Lesch.

## 2020-06-07 NOTE — Telephone Encounter (Signed)
Please see

## 2020-06-07 NOTE — Telephone Encounter (Signed)
Called and spoke with [name]. We have opening 06/09/20 at 3. The appointment was scheduled.

## 2020-06-09 ENCOUNTER — Other Ambulatory Visit: Payer: Self-pay

## 2020-06-09 ENCOUNTER — Encounter: Payer: Self-pay | Admitting: Neurology

## 2020-06-09 ENCOUNTER — Ambulatory Visit (INDEPENDENT_AMBULATORY_CARE_PROVIDER_SITE_OTHER): Payer: Medicaid Other | Admitting: Neurology

## 2020-06-09 VITALS — BP 149/94 | HR 73 | Resp 18 | Ht 74.0 in | Wt 242.0 lb

## 2020-06-09 DIAGNOSIS — R251 Tremor, unspecified: Secondary | ICD-10-CM

## 2020-06-09 DIAGNOSIS — R29898 Other symptoms and signs involving the musculoskeletal system: Secondary | ICD-10-CM | POA: Diagnosis not present

## 2020-06-09 DIAGNOSIS — G40009 Localization-related (focal) (partial) idiopathic epilepsy and epileptic syndromes with seizures of localized onset, not intractable, without status epilepticus: Secondary | ICD-10-CM

## 2020-06-09 MED ORDER — LEVETIRACETAM 500 MG PO TABS: ORAL_TABLET | ORAL | 11 refills | Status: DC

## 2020-06-09 MED ORDER — OLANZAPINE 5 MG PO TABS: ORAL_TABLET | ORAL | 11 refills | Status: DC

## 2020-06-09 NOTE — Patient Instructions (Signed)
1. Schedule MRI lumbar spine without contrast  2. Continue Keppra 500mg  twice a day  3. Continue Zyprexa 5mg  every other night  4. Follow-up in 6 months, call for any changes   FALL PRECAUTIONS: Be cautious when walking. Scan the area for obstacles that may increase the risk of trips and falls. When getting up in the mornings, sit up at the edge of the bed for a few minutes before getting out of bed. Consider elevating the bed at the head end to avoid drop of blood pressure when getting up. Walk always in a well-lit room (use night lights in the walls). Avoid area rugs or power cords from appliances in the middle of the walkways. Use a walker or a cane if necessary and consider physical therapy for balance exercise. Get your eyesight checked regularly.  FINANCIAL OVERSIGHT: Supervision, especially oversight when making financial decisions or transactions is also recommended.  HOME SAFETY: Consider the safety of the kitchen when operating appliances like stoves, microwave oven, and blender. Consider having supervision and share cooking responsibilities until no longer able to participate in those. Accidents with firearms and other hazards in the house should be identified and addressed as well.  DRIVING: Regarding driving, in patients with progressive memory problems, driving will be impaired. We advise to have someone else do the driving if trouble finding directions or if minor accidents are reported. Independent driving assessment is available to determine safety of driving.  ABILITY TO BE LEFT ALONE: If patient is unable to contact 911 operator, consider using LifeLine, or when the need is there, arrange for someone to stay with patients. Smoking is a fire hazard, consider supervision or cessation. Risk of wandering should be assessed by caregiver and if detected at any point, supervision and safe proof recommendations should be instituted.  MEDICATION SUPERVISION: Inability to self-administer  medication needs to be constantly addressed. Implement a mechanism to ensure safe administration of the medications.  RECOMMENDATIONS FOR ALL PATIENTS WITH MEMORY PROBLEMS: 1. Continue to exercise (Recommend 30 minutes of walking everyday, or 3 hours every week) 2. Increase social interactions - continue going to [place] and enjoy social gatherings with friends and family 3. Eat healthy, avoid fried foods and eat more fruits and vegetables 4. Maintain adequate blood pressure, blood sugar, and blood cholesterol level. Reducing the risk of stroke and cardiovascular disease also helps promoting better memory. 5. Avoid stressful situations. Live a simple life and avoid aggravations. Organize your time and prepare for the next day in anticipation. 6. Sleep well, avoid any interruptions of sleep and avoid any distractions in the bedroom that may interfere with adequate sleep quality 7. Avoid sugar, avoid sweets as there is a strong link between excessive sugar intake, diabetes, and cognitive impairment We discussed the Mediterranean diet, which has been shown to help patients reduce the risk of progressive memory disorders and reduces cardiovascular risk. This includes eating fish, eat fruits and green leafy vegetables, nuts like almonds and hazelnuts, walnuts, and also use olive oil. Avoid fast foods and fried foods as much as possible. Avoid sweets and sugar as sugar use has been linked to worsening of memory function.  There is always a concern of gradual progression of memory problems. If this is the case, then we may need to adjust level of care according to patient needs. Support, both to the patient and caregiver, should then be put into place.

## 2020-06-09 NOTE — Progress Notes (Signed)
NEUROLOGY FOLLOW UP OFFICE NOTE  AHAMAD SUMMAR [id number] 1956/04/14  HISTORY OF PRESENT ILLNESS: I had the pleasure of seeing Brent Ramos in follow-up in the neurology clinic on 06/09/2020.  The patient was last seen 7 months ago for seizures and tremors. His POA and aide [name] is again present to provide additional information. He and [name] deny any seizures since September 2018 on Levetiracetam 500mg  BID. He had post-ictal psychosis at that time and has been on olanzapine 5mg  every other day. When dose was reduced to 1/2 tab, he was not sleeping well. Main concern today are tremors when standing, every time he stands up, his legs would shake. They stop when he starts walking, but would come back when stationary. No falls. He denies any headaches, dizziness, focal numbness/tingling/weakness, neck/back pain, bowel/bladder dysfunction. Sleep is good with medications. He has had elevated BP despite increasing BP medications, [name] reports he has not been able to exercise since the weather cooled down, and BP has been higher.   I personally reviewed head CT without contrast done 10/2019 which did not show any acute changes. 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   History on Initial Assessment 05/30/2017: This is a pleasant 65 year old right-handed man with a history of alcohol abuse, seizures, presenting to establish care. He is accompanied by his POA and roommate [name], who provides majority of the history. They report that seizures started in his 35s. He has no prior warning before the seizures. [name] has known him for 12 years and has witnessed several seizures. He would start staring off, answers "what" but does not follow commands, followed by left head deviation then 20 seconds later a generalized convulsion. Sometimes he has clusters of 4-5 seizures lasting 1-2 minutes at a time. He had been taking Dilantin since his 33s, up to  7-8 months ago when he started having balance issues and "jerkiness." [name] noted that when he stands, he would be rocking and shaking when standing still. He has jerking in sleep that wakes him up. He was switched to Keppra, and this seemed to help better. He was in the ER in May 2018 for seizures in the setting of alcohol abuse, alcohol level 52. [name] reports that the pharmacy did not deliver [place] last September and he was off medication for a whole weekend then started having seizures. He was in the ER on 01/31/17 (per ER notes he was out of medication for 2 months due to finances). CT head showed mild atrophy, no acute changes. He was given a prescription for Keppra and discharged, then was back 3 days later after another seizure. He was then admitted on 02/05/17 after he had 6 witnessed seizures with rhabdomyolysis (CK >9000). His EEG at that time showed excess diffuse beta activity, no epileptiform discharges. He was discharged home, however [name] reports that once he got home, he was awake for 4-5 days, saying he would clean the room but pulled things out of drawers and left them askew. He was hallucinating and wandered off, [name] had to call Missing Persons 6 times, until finally the patient asked the police to take him to the ER on 02/20/17. While in the ER, he was noted to have an episode of unresponsiveness. He was evaluated by [facility] and started on Zyprexa. [name] has known him for 12 years and has never seen any manic behavior until that time, but states "he has always been mean and agitated,"  but has significantly improved on Zyprexa. He stopped drinking in August per [name]. He used to drink 2-3 18-pack of beer within 3 days. He is taking Keppra 500mg  BID without side effects, no seizures since September 2018.   He used to have a lot of headaches, but these seemed to ease up since starting the [place]. He occasionally gets dizzy upon standing. 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 He has occasional tingling in both hands and stinging on both feet. They got cold quickly. He has a lot of back pain. No bowel/bladder dysfunction. He denies anyolfactory/gustatory hallucinations, deja vu, rising epigastric sensation, myoclonic jerks.[name] reports that the patient has "always been a little off" since he has known him. He needs things repeated for him. [name] has always been in charge of bills. He does not drive. Over the past month, the patient has been taking his own medications, [name] ensures he takes them.  Epilepsy Risk Factors:Hehad a normal birth and early development. There is no history of febrile convulsions, CNS infections such as meningitis/encephalitis, significant traumatic brain injury, neurosurgical procedures, or family history of seizures.  PAST MEDICAL HISTORY: Past Medical History:  Diagnosis Date  . Alcohol abuse   . Memory loss   . Seizures ([place])    "has had them for years" (02/05/2017)  . Vision abnormalities     MEDICATIONS: Current Outpatient Medications on File Prior to Visit  Medication Sig Dispense Refill  . levETIRAcetam (KEPPRA) 500 MG tablet TAKE 1 TABLET(500 MG) BY MOUTH TWICE DAILY 60 tablet 11  . lisinopril (PRINIVIL,ZESTRIL) 20 MG tablet Take 40 mg by mouth daily.  0  . OLANZapine (ZYPREXA) 5 MG tablet Take 1 tablet every other day 15 tablet 11  . methocarbamol (ROBAXIN) 500 MG tablet Take 500 mg by mouth 2 (two) times daily. (Patient not taking: Reported on 06/09/2020)     No current facility-administered medications on file prior to visit.    ALLERGIES: No Known Allergies  FAMILY HISTORY: Family History  Problem Relation Age of Onset  . Dementia Mother     SOCIAL HISTORY: Social History   Socioeconomic History  . Marital status: Divorced    Spouse name: Not on file  . Number of children: Not on file  . Years of education: Not on file  . Highest education level: Not on file  Occupational  History  . Not on file  Tobacco Use  . Smoking status: Current Every Day Smoker    Packs/day: 0.50    Years: 45.00    Pack years: 22.50    Types: Cigarettes  . Smokeless tobacco: Former Systems developer    Types: Secondary school teacher  . Vaping Use: Never used  Substance and Sexual Activity  . Alcohol use: Yes    Comment: 02/05/2017 "nothing in the last month; h/o abuse"  . Drug use: No  . Sexual activity: Never  Other Topics Concern  . Not on file  Social History Narrative   Pt lives in 1 story home with his friend/caretaker, [name]   Has no children   Highest level of education: 11th grade   Unemployed - currently on SSI   Right handed   Drinks caffeine   Social Determinants of Health   Financial Resource Strain: Not on file  Food Insecurity: Not on file  Transportation Needs: Not on file  Physical Activity: Not on file  Stress: Not on file  Social Connections: Not on file  Intimate Partner  Violence: Not on file    PHYSICAL EXAM: Vitals:   06/09/20 1449  BP: (!) 149/94  Pulse: 73  Resp: 18  SpO2: 95%   Orthostatic vital signs: Supine BP 128/78, HR 75. Sitting BP 124/80, HR 75. 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 No aphasia or dysarthria. Fund of knowledge is reduced.  Recent and remote memory are impaired. Attention and concentration are normal. Unable to read/write due to vision changes. MMSE 26/27 MMSE - Mini Mental State Exam 06/09/2020  Orientation to time 5  Orientation to Place 5  Registration 3  Attention/ Calculation 5  Recall 2  Language- name 2 objects 2  Language- repeat 1  Language- follow 3 step command 3  Language- read & follow direction -  Write a sentence -  Copy design -  Total score 26/27   Cranial nerves: Pupils equal, round. Extraocular movements intact  with no nystagmus. Visual fields full.  No facial asymmetry.  Motor: Bulk and tone normal, no cogwheeling, muscle strength 5/5 throughout with no pronator drift.   Finger to nose testing intact.  Gait: Patient noted to have leg tremors when standing, gait is stiff but tremors stop during ambulation. No hand tremors.    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 Head CT unremarkable. His 1-hour EEG was normal. He has been sober since August 2018 and seizure-free since September 2018 on Levetiracetam 500mg  BID. He is on olanzapine 5mg  every other day, this helped with post-ictal psychosis initially, as well as sleep. Main concern has been leg tremors that primarily occur when standing, etiology unclear, possibly orthostatic tremor. BP today did not show significant orthostasis, there was slight drop in BP on standing. Structural cause will be ruled out with MRI lumbar spine without contrast. If normal, we can consider either clonazepam or gabapentin which are indicated for orthostatic tremor. Continue 24/7 care. Follow-up in 6 months, they know to call for any changes.    Thank you for allowing me to participate in his care.  Please do not hesitate to call for any questions or concerns.   Ellouise Newer, M.D.   CC: Dr. Jonelle Sidle

## 2020-06-20 ENCOUNTER — Encounter: Payer: Self-pay | Admitting: Neurology

## 2020-11-27 ENCOUNTER — Other Ambulatory Visit: Payer: Self-pay | Admitting: Neurology

## 2020-11-27 DIAGNOSIS — G40009 Localization-related (focal) (partial) idiopathic epilepsy and epileptic syndromes with seizures of localized onset, not intractable, without status epilepticus: Secondary | ICD-10-CM

## 2020-11-29 ENCOUNTER — Other Ambulatory Visit: Payer: Self-pay | Admitting: Neurology

## 2020-11-29 DIAGNOSIS — G40009 Localization-related (focal) (partial) idiopathic epilepsy and epileptic syndromes with seizures of localized onset, not intractable, without status epilepticus: Secondary | ICD-10-CM

## 2020-12-17 ENCOUNTER — Other Ambulatory Visit: Payer: Self-pay

## 2020-12-17 ENCOUNTER — Encounter: Payer: Self-pay | Admitting: Neurology

## 2020-12-17 ENCOUNTER — Ambulatory Visit (INDEPENDENT_AMBULATORY_CARE_PROVIDER_SITE_OTHER): Payer: Medicaid Other | Admitting: Neurology

## 2020-12-17 VITALS — BP 125/69 | HR 70 | Ht 74.0 in | Wt 247.2 lb

## 2020-12-17 DIAGNOSIS — R251 Tremor, unspecified: Secondary | ICD-10-CM

## 2020-12-17 DIAGNOSIS — R29898 Other symptoms and signs involving the musculoskeletal system: Secondary | ICD-10-CM

## 2020-12-17 DIAGNOSIS — G40009 Localization-related (focal) (partial) idiopathic epilepsy and epileptic syndromes with seizures of localized onset, not intractable, without status epilepticus: Secondary | ICD-10-CM | POA: Diagnosis not present

## 2020-12-17 MED ORDER — LEVETIRACETAM 500 MG PO TABS: ORAL_TABLET | ORAL | 11 refills | Status: DC

## 2020-12-17 NOTE — Progress Notes (Signed)
NEUROLOGY FOLLOW UP OFFICE NOTE  Brent Ramos [id number] Mar 18, 1956  HISTORY OF PRESENT ILLNESS: I had the pleasure of seeing Brent Ramos in follow-up in the neurology clinic on 12/17/2020.  The patient was last seen 6 months ago for seizures and tremors. His POA and aide [name] is again present to provide additional information. Records and images were personally reviewed where available.  On his last visit, main concern were leg tremors upon standing. We had discussed doing a lumbar MRI however this has not been done yet. He recently moved to a new house and feels happy about having more space with a new bed where he can stretch his legs better. They deny any seizures since September 2018 on Levetiracetam '500mg'$  BID, no side effects. He had post-ictal psychosis at that time and has been on olanzapine '5mg'$  every other day, it has helped with sleep as well. No staring/unresponsive episodes, loss of time, focal numbness/tingling, bowel/bladder dysfunction. He denies any neck or back pain. Sleep is good. He continues to have the leg shaking/bouncing when he is standing. The shaking stops when he is walking but recur when he is stationary. No falls. He was able to get in and out of tub this morning.    History on Initial Assessment 05/30/2017: This is a pleasant 65 year old right-handed man with a history of alcohol abuse, seizures, presenting to establish care. He is accompanied by his POA and roommate [name], who provides majority of the history. They report that seizures started in his 64s. He has no prior warning before the seizures. [name] has known him for 12 years and has witnessed several seizures. He would start staring off, answers "what" but does not follow commands, followed by left head deviation then 20 seconds later a generalized convulsion. Sometimes he has clusters of 4-5 seizures lasting 1-2 minutes at a time. He had been taking Dilantin since his 64s, up to 7-8 months ago when he started  having balance issues and "jerkiness." [name] noted that when he stands, he would be rocking and shaking when standing still. He has jerking in sleep that wakes him up. He was switched to Keppra, and this seemed to help better. He was in the ER in May 2018 for seizures in the setting of alcohol abuse, alcohol level 52. [name] reports that the pharmacy did not deliver [place] last September and he was off medication for a whole weekend then started having seizures. He was in the ER on 01/31/17 (per ER notes he was out of medication for 2 months due to finances). CT head showed mild atrophy, no acute changes. He was given a prescription for Keppra and discharged, then was back 3 days later after another seizure. He was then admitted on 02/05/17 after he had 6 witnessed seizures with rhabdomyolysis (CK >9000). His EEG at that time showed excess diffuse beta activity, no epileptiform discharges. He was discharged home, however [name] reports that once he got home, he was awake for 4-5 days, saying he would clean the room but pulled things out of drawers and left them askew. He was hallucinating and wandered off, [name] had to call Missing Persons 6 times, until finally the patient asked the police to take him to the ER on 02/20/17.  While in the ER, he was noted to have an episode of unresponsiveness. He was evaluated by [facility] and started on Zyprexa. [name] has known him for 12 years and has never seen any manic behavior until that time,  but states "he has always been mean and agitated," but has significantly improved on Zyprexa. He stopped drinking in August per [name]. He used to drink 2-3 18-pack of beer within 3 days. He is taking Keppra '500mg'$  BID without side effects, no seizures since September 2018.    He used to have a lot of headaches, but these seemed to ease up since starting the [place]. He occasionally gets dizzy upon standing. 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 He has  occasional tingling in both hands and stinging on both feet. They got cold quickly. He has a lot of back pain. No bowel/bladder dysfunction. He denies any olfactory/gustatory hallucinations, deja vu, rising epigastric sensation, myoclonic jerks. [name] reports that the patient has "always been a little off" since he has known him. He needs things repeated for him. [name] has always been in charge of bills. He does not drive. Over the past month, the patient has been taking his own medications, [name] ensures he takes them.    Epilepsy Risk Factors:  He had a normal birth and early development.  There is no history of febrile convulsions, CNS infections such as meningitis/encephalitis, significant traumatic brain injury, neurosurgical procedures, or family history of seizures.  I personally reviewed head CT without contrast done 10/2019 which did not show any acute changes. 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   PAST MEDICAL HISTORY: Past Medical History:  Diagnosis Date   Alcohol abuse    Memory loss    Seizures ([place])    "has had them for years" (02/05/2017)   Vision abnormalities     MEDICATIONS: Current Outpatient Medications on File Prior to Visit  Medication Sig Dispense Refill   levETIRAcetam (KEPPRA) 500 MG tablet TAKE 1 TABLET(500 MG) BY MOUTH TWICE DAILY 60 tablet 0   lisinopril (PRINIVIL,ZESTRIL) 20 MG tablet Take 40 mg by mouth daily.  0   methocarbamol (ROBAXIN) 500 MG tablet Take 500 mg by mouth 2 (two) times daily. (Patient not taking: Reported on 06/09/2020)     OLANZapine (ZYPREXA) 5 MG tablet Take 1 tablet every other day 15 tablet 11   No current facility-administered medications on file prior to visit.    ALLERGIES: No Known Allergies  FAMILY HISTORY: Family History  Problem Relation Age of Onset   Dementia Mother     SOCIAL HISTORY: Social History   Socioeconomic History   Marital status:  Divorced    Spouse name: Not on file   Number of children: Not on file   Years of education: Not on file   Highest education level: Not on file  Occupational History   Not on file  Tobacco Use   Smoking status: Every Day    Packs/day: 0.50    Years: 45.00    Pack years: 22.50    Types: Cigarettes   Smokeless tobacco: Former    Types: Nurse, children's Use: Never used  Substance and Sexual Activity   Alcohol use: Not Currently    Comment: 02/05/2017 "nothing in the last month; h/o abuse"   Drug use: No   Sexual activity: Never  Other Topics Concern   Not on file  Social History Narrative   Pt lives in 1 story home with his friend/caretaker, [name]   Has no children   Highest level of education: 11th grade   Unemployed - currently on SSI   Right handed  Drinks caffeine   Social Determinants of Radio broadcast assistant Strain: Not on file  Food Insecurity: Not on file  Transportation Needs: Not on file  Physical Activity: Not on file  Stress: Not on file  Social Connections: Not on file  Intimate Partner Violence: Not on file     PHYSICAL EXAM: Vitals:   12/17/20 1126  BP: 125/69  Pulse: 70  SpO2: 96%   General: No acute distress Head:  Normocephalic/atraumatic Skin/Extremities: No rash, no edema Neurological Exam: alert and awake. No aphasia or dysarthria. Fund of knowledge is reduced.  Recent and remote memory are impaired. Attention and concentration are normal.   Cranial nerves: Right phthisis bulbi, left pupil round. Extraocular movements intact on left with no nystagmus. Visual fields full on left eye.  No facial asymmetry.  Motor: Bulk and tone normal, muscle strength 5/5 throughout with no pronator drift. Reflexes +1 on both UE, unable to elicit on both LE.  Finger to nose testing intact.  Gait slightly wide based, no ataxia. When he is standing with eyes open, he starts having bobbing/bouncing leg tremors that stop when walking or sitting.     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 Head CT unremarkable. His 1-hour EEG was normal. He continues to do well seizure-free since September 2018 on Levetiracetam '500mg'$  BID, sober since August 2018. He is on olanzapine '5mg'$  every other day, this helped with post-ictal psychosis initially, as well as sleep. Main concern today are the leg tremors that only occur when he is standing, etiology unclear, orthostatic tremor is a possibility, no orthostasis noted on last visit. Proceed with MRI lumbar spine without contrast to assess for underlying structural abnormality. Physical therapy may help as well. If no improvement, we will consider gabapentin for treatment of orthostatic tremor. Continue 24/7 care. Follow-up in 6 months, call for any changes.     Thank you for allowing me to participate in his care.  Please do not hesitate to call for any questions or concerns.   Ellouise Newer, M.D.   CC: Dr. Jonelle Sidle

## 2020-12-17 NOTE — Patient Instructions (Signed)
Schedule MRI lumbar spine without contrast  2. Continue Keppra '500mg'$  twice a day and Olanzapine every other night  3. Hopefully PT can be approved  4. Follow-up in 6 months, call for any changes   Seizure Precautions: 1. If medication has been prescribed for you to prevent seizures, take it exactly as directed.  Do not stop taking the medicine without talking to your doctor first, even if you have not had a seizure in a long time.   2. Avoid activities in which a seizure would cause danger to yourself or to others.  Don't operate dangerous machinery, swim alone, or climb in high or dangerous places, such as on ladders, roofs, or girders.  Do not drive unless your doctor says you may.  3. If you have any warning that you may have a seizure, lay down in a safe place where you can't hurt yourself.    4.  No driving for 6 months from last seizure, as per [facility].   Please refer to the following link on the [facility] website for more information: http://[email or web address]/[email or web address]/[email or web address]   5.  Maintain good sleep hygiene. Avoid alcohol.  6.  Contact your doctor if you have any problems that may be related to the medicine you are taking.  7.  Call 911 and bring the patient back to the ED if:        A.  The seizure lasts longer than 5 minutes.       B.  The patient doesn't awaken shortly after the seizure  C.  The patient has new problems such as difficulty seeing, speaking or moving  D.  The patient was injured during the seizure  E.  The patient has a temperature over 102 F (39C)  F.  The patient vomited and now is having trouble breathing

## 2020-12-28 ENCOUNTER — Telehealth: Payer: Self-pay | Admitting: Neurology

## 2020-12-29 NOTE — Telephone Encounter (Signed)
Patient's friend called in stating the pharmacy stated they need prior authorization for the olanzapine. He also would like a call back once it's approved.

## 2020-12-31 ENCOUNTER — Telehealth: Payer: Self-pay | Admitting: Neurology

## 2020-12-31 MED ORDER — LEVETIRACETAM 750 MG PO TABS: 750.0000 mg | ORAL_TABLET | Freq: Two times a day (BID) | ORAL | 1 refills | Status: DC

## 2020-12-31 NOTE — Telephone Encounter (Signed)
Pt friend Brent Ramos called to let doctor know Brent Ramos had a seizure 12/30/20. He wants to know if the dosage needs to be upped or changed

## 2020-12-31 NOTE — Telephone Encounter (Signed)
Pt c/o: seizure Missed medications?  No. Sleep deprived?  Yes.   Staying up late watching TV Alcohol intake?  Yes.   Some drinks last weekend  Back to their usual baseline self?  Yes.  . If no, advise go to ER EMS came out VS good didn't go to the ER  Current medications prescribed by Dr. Delice Lesch: Levetiracetam '500mg'$   1 tab BID  Has been watching more TV than normal.  Past 3 days had a stutter with saying I after seizure talking fine  Appointment with PCP on 01/05/21. X-ray on 01/08/21   Seizure was right before his night time medication after he had calmed down pt care giver gave him his night time seizure med plus an extra dose.

## 2020-12-31 NOTE — Telephone Encounter (Signed)
Patient caregiver [name] called to let us know that the patient had a seizure last night. He was not sure if the medication on would need to be changed or if you would want to know how his days were leading up to yesterday  please call

## 2020-12-31 NOTE — Telephone Encounter (Signed)
Spoke with pt care giver informed him increase levetiracetam to '750mg'$  twice daily.  He must also make sure to get adequate sleep and not stay up late watching TV.

## 2021-01-06 ENCOUNTER — Ambulatory Visit
Admission: RE | Admit: 2021-01-06 | Discharge: 2021-01-06 | Disposition: A | Discharge status: Home / Self Care | Payer: Medicaid Other | Source: Ambulatory Visit | Attending: Neurology | Admitting: Neurology

## 2021-01-06 ENCOUNTER — Other Ambulatory Visit: Payer: Self-pay

## 2021-01-06 DIAGNOSIS — R29898 Other symptoms and signs involving the musculoskeletal system: Secondary | ICD-10-CM

## 2021-01-06 DIAGNOSIS — G40009 Localization-related (focal) (partial) idiopathic epilepsy and epileptic syndromes with seizures of localized onset, not intractable, without status epilepticus: Secondary | ICD-10-CM

## 2021-01-06 DIAGNOSIS — R251 Tremor, unspecified: Secondary | ICD-10-CM

## 2021-01-06 NOTE — Telephone Encounter (Signed)
F/u  Pending   Brent Ramos (Key: [id number]) Brent Ramos (olanzapine and samidorphan) 5-'10MG'$  tablets   Form [place] Medicaid ASAP: Adult Safety with Antipsychotic Prescribing Prior Authorization Form  Plan Contact [phone] phone [phone] fax Created 17 minutes ago Sent to Plan 1 minute ago Determination Wait for Determination Please wait for the payer to return a determination.

## 2021-01-25 ENCOUNTER — Telehealth: Payer: Self-pay | Admitting: Neurology

## 2021-01-25 ENCOUNTER — Other Ambulatory Visit: Payer: Self-pay

## 2021-01-25 DIAGNOSIS — D492 Neoplasm of unspecified behavior of bone, soft tissue, and skin: Secondary | ICD-10-CM

## 2021-01-25 NOTE — Telephone Encounter (Signed)
Discussed lumbar MRI findings with his POA [name], no significant changes to cause the leg tremors he is having. 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

## 2021-01-25 NOTE — Telephone Encounter (Signed)
MRI lumbar spine with contrast order placed in Epic

## 2021-02-15 ENCOUNTER — Ambulatory Visit
Admission: RE | Admit: 2021-02-15 | Discharge: 2021-02-15 | Disposition: A | Discharge status: Home / Self Care | Payer: Medicaid Other | Source: Ambulatory Visit | Attending: Neurology | Admitting: Neurology

## 2021-02-15 ENCOUNTER — Other Ambulatory Visit: Payer: Self-pay

## 2021-02-15 DIAGNOSIS — D492 Neoplasm of unspecified behavior of bone, soft tissue, and skin: Secondary | ICD-10-CM

## 2021-02-15 MED ORDER — GADOBENATE DIMEGLUMINE 529 MG/ML IV SOLN
20.0000 mL | Freq: Once | INTRAVENOUS | Status: AC | PRN
  Administered 2021-02-15: 20 mL via INTRAVENOUS

## 2021-02-15 MED ORDER — GADOBENATE DIMEGLUMINE 529 MG/ML IV SOLN: 20.0000 mL | Freq: Once | INTRAVENOUS | Status: DC | PRN

## 2021-02-24 ENCOUNTER — Telehealth: Payer: Self-pay

## 2021-02-24 NOTE — Telephone Encounter (Signed)
Spoke with Mr Brent Ramos repeat MRI lumbar spine showed arthritis changes with mild to moderate spinal stenosis in the lower back. The very small growth we wanted to look at is just a small overgrowth of the lining of the nerves but nothing pressing on anything and would not cause the shaking in his legs

## 2021-02-24 NOTE — Telephone Encounter (Signed)
-----   Message from Cameron Sprang, MD sent at 02/23/2021 10:38 PM EDT ----- Pls let his caregiver know the repeat MRI lumbar spine showed arthritis changes with mild to moderate spinal stenosis in the lower back. The very small growth we wanted to look at is just a small overgrowth of the lining of the nerves but nothing pressing on anything and would not cause the shaking in his legs. Thanks

## 2021-03-31 ENCOUNTER — Other Ambulatory Visit: Payer: Self-pay | Admitting: Neurology

## 2021-07-11 ENCOUNTER — Other Ambulatory Visit: Payer: Self-pay

## 2021-07-11 ENCOUNTER — Ambulatory Visit (INDEPENDENT_AMBULATORY_CARE_PROVIDER_SITE_OTHER): Payer: Medicaid Other | Admitting: Neurology

## 2021-07-11 ENCOUNTER — Encounter: Payer: Self-pay | Admitting: Neurology

## 2021-07-11 VITALS — BP 127/76 | HR 78 | Ht 74.0 in | Wt 253.6 lb

## 2021-07-11 DIAGNOSIS — R251 Tremor, unspecified: Secondary | ICD-10-CM | POA: Diagnosis not present

## 2021-07-11 DIAGNOSIS — G40009 Localization-related (focal) (partial) idiopathic epilepsy and epileptic syndromes with seizures of localized onset, not intractable, without status epilepticus: Secondary | ICD-10-CM | POA: Diagnosis not present

## 2021-07-11 MED ORDER — LEVETIRACETAM 750 MG PO TABS: ORAL_TABLET | ORAL | 3 refills | Status: DC

## 2021-07-11 MED ORDER — GABAPENTIN 100 MG PO CAPS
ORAL_CAPSULE | ORAL | 11 refills | Status: DC
Start: 2021-07-11 — End: 2022-07-11

## 2021-07-11 MED ORDER — OLANZAPINE 5 MG PO TABS: ORAL_TABLET | ORAL | 3 refills | Status: DC

## 2021-07-11 NOTE — Progress Notes (Signed)
NEUROLOGY FOLLOW UP OFFICE NOTE  Brent Ramos [id number] May 19, 1955  HISTORY OF PRESENT ILLNESS: I had the pleasure of seeing Brent Ramos in follow-up in the neurology clinic on 65/27/2023.  The patient was last seen 6 months ago for seizures and tremors. His POA and aide [name] is again present to provide additional information. Since his last visit, [name] called our office to report a seizure on 12/30/20. There was no prior warning, he was watching late night TV, [name] recalls there was a big flash on the TV then he saw [name] shaking. He came out of it then went into another one. The seizures lasted 1.5-2 minutes, no tongue bite or incontinence. He was tired after, no focal weakness, no post-ictal psychosis. He had been staying up late and had some drinks the prior weekend. Levetiracetam dose incrased to 750mg  BID. Prior to this, he had been seizure-free for 4 years. He had post-ictal psychosis with prior seizure and has been on olanzapine 5mg  every other day since then. This helps with sleep as well. He has been having leg tremors upon standing, MRI lumbar spine with and without contrast done Aug/Oct 2022 did not show any acute changes, cord normal. There were mild degenerative changes, more pronounced at L4-5 with mild spinal canal stenosis with narrowing of bilateral subarticular zones. 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 He continues to have the "leg bouncing" on standing, resolving once he walks. He denies any headaches, dizziness, no falls. Some days he is more tired.    History on Initial Assessment 05/30/2017: This is a pleasant 66 year old right-handed man with a history of alcohol abuse, seizures, presenting to establish care. He is accompanied by his POA and roommate [name], who provides majority of the history. They report that seizures started in his  24s. He has no prior warning before the seizures. [name] has known him for 12 years and has witnessed several seizures. He would start staring off, answers "what" but does not follow commands, followed by left head deviation then 20 seconds later a generalized convulsion. Sometimes he has clusters of 4-5 seizures lasting 1-2 minutes at a time. He had been taking Dilantin since his 40s, up to 7-8 months ago when he started having balance issues and "jerkiness." [name] noted that when he stands, he would be rocking and shaking when standing still. He has jerking in sleep that wakes him up. He was switched to Keppra, and this seemed to help better. He was in the ER in May 2018 for seizures in the setting of alcohol abuse, alcohol level 52. [name] reports that the pharmacy did not deliver Nellie last September and he was off medication for a whole weekend then started having seizures. He was in the ER on 01/31/17 (per ER notes he was out of medication for 2 months due to finances). CT head showed mild atrophy, no acute changes. He was given a prescription for Keppra and discharged, then was back 3 days later after another seizure. He was then admitted on 02/05/17 after he had 6 witnessed seizures with rhabdomyolysis (CK >9000). His EEG at that time showed excess diffuse beta activity, no epileptiform discharges. He was discharged home, however [name] reports that once he got home, he was awake for 4-5 days, saying he would clean the room but pulled things out of drawers and left  them askew. He was hallucinating and wandered off, [name] had to call Missing Persons 6 times, until finally the patient asked the police to take him to the ER on 02/20/17.  While in the ER, he was noted to have an episode of unresponsiveness. He was evaluated by [facility] and started on Zyprexa. [name] has known him for 12 years and has never seen any manic behavior until that time, but states "he has always been mean and agitated," but  has significantly improved on Zyprexa. He stopped drinking in August per [name]. He used to drink 2-3 18-pack of beer within 3 days. He is taking Keppra 500mg  BID without side effects, no seizures since September 2018.    He used to have a lot of headaches, but these seemed to ease up since starting the [place]. He occasionally gets dizzy upon standing. 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 He has occasional tingling in both hands and stinging on both feet. They got cold quickly. He has a lot of back pain. No bowel/bladder dysfunction. He denies any olfactory/gustatory hallucinations, deja vu, rising epigastric sensation, myoclonic jerks. [name] reports that the patient has "always been a little off" since he has known him. He needs things repeated for him. [name] has always been in charge of bills. He does not drive. Over the past month, the patient has been taking his own medications, [name] ensures he takes them.    Epilepsy Risk Factors:  He had a normal birth and early development.  There is no history of febrile convulsions, CNS infections such as meningitis/encephalitis, significant traumatic brain injury, neurosurgical procedures, or family history of seizures.  I personally reviewed head CT without contrast done 10/2019 which did not show any acute changes. 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  PAST MEDICAL HISTORY: Past Medical History:  Diagnosis Date   Alcohol abuse    Memory loss    Seizures ([place])    "has had them for years" (02/05/2017)   Vision abnormalities     MEDICATIONS: Current Outpatient Medications on File Prior to Visit  Medication Sig Dispense Refill   levETIRAcetam (KEPPRA) 750 MG tablet TAKE 1 TABLET(750 MG) BY MOUTH TWICE DAILY 180 tablet 0   lisinopril (ZESTRIL) 40 MG tablet Take 40 mg by mouth daily.  0   OLANZapine (ZYPREXA) 5 MG tablet Take 1 tablet every other day 15 tablet 11    No current facility-administered medications on file prior to visit.    ALLERGIES: No Known Allergies  FAMILY HISTORY: Family History  Problem Relation Age of Onset   Dementia Mother     SOCIAL HISTORY: Social History   Socioeconomic History   Marital status: Divorced    Spouse name: Not on file   Number of children: Not on file   Years of education: Not on file   Highest education level: Not on file  Occupational History   Not on file  Tobacco Use   Smoking status: Every Day    Packs/day: 0.50    Years: 45.00    Pack years: 22.50    Types: Cigarettes   Smokeless tobacco: Former    Types: Nurse, children's Use: Never used  Substance and Sexual Activity   Alcohol use: Not Currently    Comment: 02/05/2017 "nothing in the last month; h/o abuse"   Drug use: No   Sexual activity: Never  Other Topics Concern  Not on file  Social History Narrative   Pt lives in 1 story home with his friend/caretaker, [name]   Has no children   Highest level of education: 11th grade   Unemployed - currently on SSI   Right handed   Drinks caffeine   Social Determinants of Health   Financial Resource Strain: Not on file  Food Insecurity: Not on file  Transportation Needs: Not on file  Physical Activity: Not on file  Stress: Not on file  Social Connections: Not on file  Intimate Partner Violence: Not on file     PHYSICAL EXAM: Vitals:   07/11/21 0817  BP: 127/76  Pulse: 78  SpO2: 95%   General: No acute distress Head:  Normocephalic/atraumatic Skin/Extremities: No rash, no edema Neurological Exam: alert and awake. No aphasia or dysarthria. Fund of knowledge is appropriate.  Attention and concentration are normal.   Cranial nerves: Right phthisis bulbi, left pupil round. Extraocular movements intact on left with no nystagmus. Visual fields full on left eye.  No facial asymmetry.  Motor: Tone increased but no clear cogwheeling, muscle strength 5/5 throughout with  no pronator drift.   Finger to nose testing intact.  Gait he starts having bobbing/bouncing leg tremors upon standing that resolve when walking, none when sitting. Gait slow and cautious, slightly wide-based,no ataxia.    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 Head CT unremarkable. His 1-hour EEG was normal. He had been seizure-free for 4 years until he had a seizure on 12/30/20, now on Levetiracetam 750mg  BID. He has olanzapine 5mg  every other day which helped with post-ictal psychosis initially, as well as sleep. He continues to have leg tremors that only occur when standing, etiology unclear, no structural abnormality, orthostatic tremor is a possibility although no orthostasis on prior visit. We discussed empiric treatment with gabapentin for orthostatic tremor, side effects discussed. Start gabapentin 100mg  qhs x 1 week, then increase to 100mg  BID. Update our office in 2 months. Can also try abdominal binder or compression stockings. We discussed avoidance of seizure triggers, including alcohol and sleep deprivation. He does not drive. Continue 24/7 care. Follow-up in 6 months, call for any changes.     Thank you for allowing me to participate in his care.  Please do not hesitate to call for any questions or concerns.    Ellouise Newer, M.D.   CC: Dr. Jonelle Sidle

## 2021-07-11 NOTE — Patient Instructions (Signed)
Good to see you.  Start Gabapentin 100mg : take 1 capsule every night for 1 week, then increase to 1 capsule twice a day. See how it helps with the tremor. If no change, can try an abdominal binder or compression stockings  2. Continue Keppra (Levetiracetam) 750mg  twice a day and olanzapine 5mg  every other day  3. Follow-up in 6 months, call for any changes   Seizure Precautions: 1. If medication has been prescribed for you to prevent seizures, take it exactly as directed.  Do not stop taking the medicine without talking to your doctor first, even if you have not had a seizure in a long time.   2. Avoid activities in which a seizure would cause danger to yourself or to others.  Don't operate dangerous machinery, swim alone, or climb in high or dangerous places, such as on ladders, roofs, or girders.  Do not drive unless your doctor says you may.  3. If you have any warning that you may have a seizure, lay down in a safe place where you can't hurt yourself.    4.  No driving for 6 months from last seizure, as per [facility].   Please refer to the following link on the [place] website for more information: http://[email or web address]/[email or web address]/driving/drivingu.cfm   5.  Maintain good sleep hygiene. Avoid alcohol.  6.  Contact your doctor if you have any problems that may be related to the medicine you are taking.  7.  Call 911 and bring the patient back to the ED if:        A.  The seizure lasts longer than 5 minutes.       B.  The patient doesn't awaken shortly after the seizure  C.  The patient has new problems such as difficulty seeing, speaking or moving  D.  The patient was injured during the seizure  E.  The patient has a temperature over 102 F (39C)  F.  The patient vomited and now is having trouble breathing

## 2021-09-16 ENCOUNTER — Other Ambulatory Visit (HOSPITAL_COMMUNITY): Payer: Self-pay

## 2021-09-16 ENCOUNTER — Encounter (HOSPITAL_COMMUNITY): Payer: Self-pay | Admitting: Emergency Medicine

## 2021-09-16 ENCOUNTER — Emergency Department (HOSPITAL_COMMUNITY): Payer: Medicaid Other

## 2021-09-16 ENCOUNTER — Emergency Department (HOSPITAL_COMMUNITY)
Admission: EM | Admit: 2021-09-16 | Discharge: 2021-09-16 | Disposition: A | Discharge status: Home / Self Care | Payer: Medicaid Other | Attending: Emergency Medicine | Admitting: Emergency Medicine

## 2021-09-16 ENCOUNTER — Telehealth: Payer: Self-pay | Admitting: *Deleted

## 2021-09-16 DIAGNOSIS — Y92007 Garden or yard of unspecified non-institutional (private) residence as the place of occurrence of the external cause: Secondary | ICD-10-CM | POA: Insufficient documentation

## 2021-09-16 DIAGNOSIS — F1010 Alcohol abuse, uncomplicated: Secondary | ICD-10-CM | POA: Diagnosis not present

## 2021-09-16 DIAGNOSIS — Y905 Blood alcohol level of 100-119 mg/100 ml: Secondary | ICD-10-CM | POA: Diagnosis not present

## 2021-09-16 DIAGNOSIS — S82841A Displaced bimalleolar fracture of right lower leg, initial encounter for closed fracture: Secondary | ICD-10-CM | POA: Diagnosis not present

## 2021-09-16 DIAGNOSIS — W010XXA Fall on same level from slipping, tripping and stumbling without subsequent striking against object, initial encounter: Secondary | ICD-10-CM | POA: Insufficient documentation

## 2021-09-16 DIAGNOSIS — Y9301 Activity, walking, marching and hiking: Secondary | ICD-10-CM | POA: Diagnosis not present

## 2021-09-16 DIAGNOSIS — S82891A Other fracture of right lower leg, initial encounter for closed fracture: Secondary | ICD-10-CM

## 2021-09-16 DIAGNOSIS — S99911A Unspecified injury of right ankle, initial encounter: Secondary | ICD-10-CM | POA: Diagnosis present

## 2021-09-16 LAB — CBC WITH DIFFERENTIAL/PLATELET
Abs Immature Granulocytes: 0.12 10*3/uL — ABNORMAL HIGH (ref 0.00–0.07)
Basophils Absolute: 0.1 10*3/uL (ref 0.0–0.1)
Basophils Relative: 1 %
Eosinophils Absolute: 0.1 10*3/uL (ref 0.0–0.5)
Eosinophils Relative: 1 %
HCT: 44.6 % (ref 39.0–52.0)
Hemoglobin: 14.4 g/dL (ref 13.0–17.0)
Immature Granulocytes: 1 %
Lymphocytes Relative: 15 %
Lymphs Abs: 1.9 10*3/uL (ref 0.7–4.0)
MCH: 28.7 pg (ref 26.0–34.0)
MCHC: 32.3 g/dL (ref 30.0–36.0)
MCV: 88.8 fL (ref 80.0–100.0)
Monocytes Absolute: 0.8 10*3/uL (ref 0.1–1.0)
Monocytes Relative: 7 %
Neutro Abs: 9.9 10*3/uL — ABNORMAL HIGH (ref 1.7–7.7)
Neutrophils Relative %: 75 %
Platelets: 277 10*3/uL (ref 150–400)
RBC: 5.02 MIL/uL (ref 4.22–5.81)
RDW: 14.1 % (ref 11.5–15.5)
WBC: 12.8 10*3/uL — ABNORMAL HIGH (ref 4.0–10.5)
nRBC: 0 % (ref 0.0–0.2)

## 2021-09-16 LAB — COMPREHENSIVE METABOLIC PANEL
ALT: 25 U/L (ref 0–44)
AST: 20 U/L (ref 15–41)
Albumin: 4 g/dL (ref 3.5–5.0)
Alkaline Phosphatase: 114 U/L (ref 38–126)
Anion gap: 10 (ref 5–15)
BUN: 11 mg/dL (ref 8–23)
CO2: 24 mmol/L (ref 22–32)
Calcium: 8.9 mg/dL (ref 8.9–10.3)
Chloride: 105 mmol/L (ref 98–111)
Creatinine, Ser: 1.26 mg/dL — ABNORMAL HIGH (ref 0.61–1.24)
GFR, Estimated: >60 mL/min (ref >60)
Glucose, Bld: 99 mg/dL (ref 70–99)
Potassium: 4.2 mmol/L (ref 3.5–5.1)
Sodium: 139 mmol/L (ref 135–145)
Total Bilirubin: 0.7 mg/dL (ref 0.3–1.2)
Total Protein: 6.9 g/dL (ref 6.5–8.1)

## 2021-09-16 LAB — ETHANOL: Alcohol, Ethyl (B): 102 mg/dL — ABNORMAL HIGH (ref <10)

## 2021-09-16 MED ORDER — WHEELCHAIR MISC: 1.0000 | Freq: Every day | 0 refills | Status: AC

## 2021-09-16 MED ORDER — HYDROCODONE-ACETAMINOPHEN 5-325 MG PO TABS
1.0000 | ORAL_TABLET | Freq: Four times a day (QID) | ORAL | 0 refills | Status: DC | PRN
  Filled 2021-09-19: qty 60, 15d supply, fill #0

## 2021-09-16 MED ORDER — FENTANYL CITRATE PF 50 MCG/ML IJ SOSY
50.0000 ug | PREFILLED_SYRINGE | Freq: Once | INTRAMUSCULAR | Status: AC
  Administered 2021-09-16: 50 ug via INTRAVENOUS
  Filled 2021-09-16: qty 1

## 2021-09-16 MED ORDER — HYDROCODONE-ACETAMINOPHEN 5-325 MG PO TABS
1.0000 | ORAL_TABLET | Freq: Four times a day (QID) | ORAL | 0 refills | Status: DC | PRN
  Filled 2021-09-16 (×2): qty 12, 3d supply, fill #0

## 2021-09-16 MED ORDER — PROPOFOL 10 MG/ML IV BOLUS
0.5000 mg/kg | Freq: Once | INTRAVENOUS | Status: AC
Start: 2021-09-16 — End: 2021-09-16
  Administered 2021-09-16: 52.2 mg via INTRAVENOUS
  Filled 2021-09-16: qty 20

## 2021-09-16 MED ORDER — WHEELCHAIR MISC
1.0000 | Freq: Every day | 0 refills | Status: DC
  Filled 2021-09-16: qty 1, fill #0

## 2021-09-16 NOTE — ED Provider Notes (Signed)
? ?[place]  ?Provider Note ? ?CSN: [id number] ?Arrival date & time: 09/16/21 0039 ? ?History ?Chief Complaint  ?Patient presents with  ? Fall  ? ? ?Brent Ramos is a 66 y.o. male with history of alcohol use reports he fell in his yard just prior to arrival injuring his R ankle. EMS reports a deformity. He was unable to stand on it. He has been drinking tonight. Denies any other injuries. Complaining of severe pain to the R ankle.  ? ? ?Home Medications ?Prior to Admission medications   ?Medication Sig Start Date End Date Taking? Authorizing Provider  ?HYDROcodone-acetaminophen (NORCO/VICODIN) 5-325 MG tablet Take 1 tablet by mouth every 6 (six) hours as needed for severe pain. 09/16/21  Yes Truddie Hidden, MD  ?gabapentin (NEURONTIN) 100 MG capsule Take 1 capsule every night for 1 week, then increase to 1 capsule twice a day 07/11/21   Cameron Sprang, MD  ?levETIRAcetam (KEPPRA) 750 MG tablet TAKE 1 TABLET(750 MG) BY MOUTH TWICE DAILY 07/11/21   Cameron Sprang, MD  ?lisinopril (ZESTRIL) 40 MG tablet Take 40 mg by mouth daily. 11/30/17   [provider]  ?[place] [facility]) MISC 1 each by Does not apply route daily in the afternoon. 09/16/21   Truddie Hidden, MD  ?OLANZapine [facility]) 5 MG tablet Take 1 tablet every other day 07/11/21   Cameron Sprang, MD  ? ? ? ?Allergies    ?Patient has no known allergies. ? ? ?Review of Systems   ?Review of Systems ?Please see HPI for pertinent positives and negatives ? ?Physical Exam ?BP 124/86   Pulse 85   Temp 98 ?F (36.7 ?C) (Oral)   Resp 19   Ht '6\' 2"'$  (1.88 m)   Wt 104.3 kg   SpO2 97%   BMI 29.53 kg/m?  ? ?Physical Exam ?Vitals and nursing note reviewed.  ?Constitutional:   ?   Appearance: Normal appearance.  ?HENT:  ?   Head: Normocephalic and atraumatic.  ?   Nose: Nose normal.  ?   Mouth/Throat:  ?   Mouth: Mucous membranes are moist.  ?Eyes:  ?   Extraocular Movements: Extraocular movements intact.  ?    Conjunctiva/sclera: Conjunctivae normal.  ?Cardiovascular:  ?   Rate and Rhythm: Normal rate.  ?   Pulses: Normal pulses.  ?Pulmonary:  ?   Effort: Pulmonary effort is normal.  ?   Breath sounds: Normal breath sounds.  ?Abdominal:  ?   General: Abdomen is flat.  ?   Palpations: Abdomen is soft.  ?   Tenderness: There is no abdominal tenderness.  ?Musculoskeletal:     ?   General: Swelling and deformity (R ankle, some tending of skin medial maleolus) present.  ?   Cervical back: Neck supple.  ?Skin: ?   General: Skin is warm and dry.  ?Neurological:  ?   General: No focal deficit present.  ?   Mental Status: He is alert.  ?Psychiatric:     ?   Mood and Affect: Mood normal.  ? ? ?ED Results / Procedures / Treatments   ?EKG ?None ? ?Procedures ?Marland KitchenOrtho Injury Treatment ? ?Date/Time: 09/16/2021 4:55 AM ?Performed by: Truddie Hidden, MD ?Authorized by: Truddie Hidden, MD  ? ?Consent:  ?  Consent obtained:  Verbal and written ?  Consent given by:  Patient ?  Risks discussed:  Irreducible dislocation, stiffness and restricted joint movement ?  Alternatives discussed:  No  treatmentInjury location: ankle ?Location details: right ankle ?Injury type: fracture ?Fracture type: bimalleolar ?Pre-procedure neurovascular assessment: neurovascularly intact ?Pre-procedure distal perfusion: normal ?Pre-procedure neurological function: normal ?Pre-procedure range of motion: reduced ? ?Anesthesia: ?Local anesthesia used: no ? ?Patient sedated: Yes. Refer to sedation procedure documentation for details of sedation. ?Manipulation performed: yes ?Reduction successful: yes ?X-ray confirmed reduction: yes ?Immobilization: splint ?Splint type: ankle stirrup and short leg ?Splint Applied by: ED Provider and Ortho Tech ?Supplies used: Ortho-Glass ?Post-procedure neurovascular assessment: post-procedure neurovascularly intact ?Post-procedure distal perfusion: normal ?Post-procedure neurological function: normal ? ? ?Marland KitchenSedation ? ?Date/Time:  09/16/2021 4:56 AM ?Performed by: Truddie Hidden, MD ?Authorized by: Truddie Hidden, MD  ? ?Consent:  ?  Consent obtained:  Written and verbal ?  Consent given by:  Patient ?Universal protocol:  ?  Immediately prior to procedure, a time out was called: yes   ?  Patient identity confirmed:  Anonymous protocol, patient vented/unresponsive ?Indications:  ?  Procedure performed:  Dislocation reduction ?  Procedure necessitating sedation performed by:  Physician performing sedation ?Pre-sedation assessment:  ?  Time since last food or drink:  4 ?  ASA classification: class 2 - patient with mild systemic disease   ?  Mallampati score:  II - soft palate, uvula, fauces visible ?  Neck mobility: normal   ?  Pre-sedation assessments completed and reviewed: airway patency, cardiovascular function, hydration status, mental status, nausea/vomiting, pain level, respiratory function and temperature   ?  Pre-sedation assessment completed:  09/16/2021 3:00 AM ?Immediate pre-procedure details:  ?  Reassessment: Patient reassessed immediately prior to procedure   ?  Reviewed: vital signs   ?  Verified: bag valve mask available, emergency equipment available, intubation equipment available, IV patency confirmed, oxygen available and suction available   ?Procedure details (see MAR for exact dosages):  ?  Preoxygenation:  Nasal cannula ?  Sedation:  Propofol ?  Intended level of sedation: deep ?  Analgesia:  Fentanyl ?  Intra-procedure monitoring:  Blood pressure monitoring, continuous capnometry, frequent LOC assessments, cardiac monitor, continuous pulse oximetry and frequent vital sign checks ?  Intra-procedure events: none   ?  Total Provider sedation time (minutes):  15 ?Post-procedure details:  ?  Attendance: Constant attendance by certified staff until patient recovered   ?  Recovery: Patient returned to pre-procedure baseline   ?  Procedure completion:  Tolerated well, no immediate complications ? ?Medications Ordered in the  ED ?Medications  ?fentaNYL (SUBLIMAZE) injection 50 mcg (50 mcg Intravenous Given 09/16/21 0144)  ?propofol (DIPRIVAN) 10 mg/mL bolus/IV push 52.2 mg (52.2 mg Intravenous Given 09/16/21 0423)  ?fentaNYL (SUBLIMAZE) injection 50 mcg (50 mcg Intravenous Given 09/16/21 0404)  ? ? ?Initial Impression and Plan ? Patient with ankle injury and deformity with tenting of skin but not open. Distal pulse is normal. Will check labs and xray. He has had EtOH tonight.  ? ?ED Course  ? ?Clinical Course as of 09/16/21 0658  ?Fri Sep 16, 2021  ?0208 I personally viewed the images from radiology studies and agree with radiologist interpretation: R ankle fracture with some displacement. Will need sedation, reduction and splinting. Timing may depend on NPO status ? [CS]  ?0227 CBC with mild leukocytosis, CMP is unremarkable. EtOH is mildly elevated.  [CS]  ?0239 Patient's aid is now at the bedside. He is asking for the patient to be repositioned so his ankle is not on the edge of the bed. I explained that staff is busy taking care of other patient  and will be in shortly to help him. He became irritated and upset that it wasn't happening faster and asked me to leave the room. I advised I would be back to reduce and splint the ankle when I had staff available to help.  [CS]  ?3762 Reviewed pre-reduction films with Dr. Lucia Gaskins, Ortho, who agrees with reduction, splinting and if better aligned outpatient follow up in the office.  [CS]  ?0534 Post-reduction films improved. Now also maybe a trimalleolar fracture. He is feeling better in the splint. He has an aid who is with him and can help him in and out of a wheelchair. Advised non-weight bearing on the right foot. Keep it elevated and follow up with Ortho.  [CS]  ?  ?Clinical Course User Index ?[CS] Truddie Hidden, MD  ? ? ? ?MDM Rules/Calculators/A&P ?Medical Decision Making ?Problems Addressed: ?Closed fracture of right ankle, initial encounter: acute illness or injury ? ?Amount and/or  Complexity of Data Reviewed ?Labs: ordered. ?Radiology: ordered. ? ?Risk ?OTC drugs. ?Prescription drug management. ? ? ? ?Final Clinical Impression(s) / ED Diagnoses ?Final diagnoses:  ?Closed fracture of right

## 2021-09-16 NOTE — ED Notes (Signed)
Pt called his home health aid, [name], to pick him up from lobby for discharge ?

## 2021-09-16 NOTE — ED Notes (Signed)
E-signature pad unavailable at time of pt discharge. This RN discussed discharge materials with pt and answered all pt questions. Pt stated understanding of discharge material. ? ?

## 2021-09-16 NOTE — Progress Notes (Signed)
Orthopedic Tech Progress Note ?Patient Details:  ?Brent Ramos ?[birth year] ?[id number] ? ?Ortho Devices ?Type of Ortho Device: Stirrup splint, Short leg splint ?Ortho Device/Splint Location: rle ?Ortho Device/Splint Interventions: Ordered, Application, Adjustment ? Assisted ED DR with splint. ?Post Interventions ?Patient Tolerated: Well ? ?Brent Ramos ?09/16/2021, 4:31 AM ? ?

## 2021-09-16 NOTE — ED Notes (Signed)
Pt intends to call live-in home aide for ride home ?

## 2021-09-16 NOTE — Sedation Documentation (Signed)
ED provider and orthopedic technician applying splint to pt at this time following closed reduction ?

## 2021-09-16 NOTE — Telephone Encounter (Signed)
Pt called regarding which pharmacy Rx was e-scribed to.  RNCM reviewed chart to access After Visit Summary and found that Rx was sent to [facility].  Advised pt to call before picking up to insure Rx is ready. ? ?

## 2021-09-16 NOTE — ED Notes (Signed)
Electronic Consent for sedation and reduction signed by pt and witnessed/signatured by this RN ?

## 2021-09-16 NOTE — ED Notes (Signed)
Pt's caregiver came up to nurses desk demanding to talk to someone and stating "his ankle is hanging way off the damn bed".Caregiver cussing at staff. Secretary calmly explaining to visitor that staff is busy. Caregiver responding aggressively using explicit language. This RN walked into room when available and helped move patient up in bed, ankle still slightly off bed due to pt's height. At this time, caregiver was talking in a heightened tone towards pt, stating "you don't need to be worrying about pain meds, focus on that ankle". This RN removed self from room at this time.   ?

## 2021-09-16 NOTE — Discharge Instructions (Addendum)
Please use a wheelchair to get around until you are able to see Orthopedics. Keep your foot elevated as much as possible. Avoid alcohol use.  ? ?There is a Producer, television/film/video of pain medications in pharmacies. You will need to pick up your pain medications from the [facility].  ?

## 2021-09-16 NOTE — ED Triage Notes (Addendum)
Pt bib EMS from home after walking through his yard, slipping, and falling. No LOC. Pt admits to drinking "two 40s" of alcohol. Rolled onto R ankle, obvious swelling noted. Pedal pulses intact ? ?EMS vitals ?128/86 ?Pulse 100 ?RR 16 ?97% room air ?CBG 107 ?

## 2021-09-19 ENCOUNTER — Other Ambulatory Visit (HOSPITAL_COMMUNITY): Payer: Self-pay

## 2021-09-19 ENCOUNTER — Other Ambulatory Visit: Payer: Self-pay | Admitting: Orthopaedic Surgery

## 2021-09-19 MED ORDER — HYDROCODONE-ACETAMINOPHEN 10-325 MG PO TABS
1.0000 | ORAL_TABLET | Freq: Four times a day (QID) | ORAL | 0 refills | Status: DC | PRN
  Filled 2021-09-19 – 2021-09-20 (×2): qty 60, 15d supply, fill #0

## 2021-09-20 ENCOUNTER — Other Ambulatory Visit: Payer: Self-pay

## 2021-09-20 ENCOUNTER — Encounter (HOSPITAL_BASED_OUTPATIENT_CLINIC_OR_DEPARTMENT_OTHER): Payer: Self-pay | Admitting: Orthopaedic Surgery

## 2021-09-20 ENCOUNTER — Other Ambulatory Visit (HOSPITAL_COMMUNITY): Payer: Self-pay

## 2021-09-26 NOTE — Anesthesia Preprocedure Evaluation (Addendum)
Anesthesia Evaluation  ?Patient identified by MRN, date of birth, ID band ?Patient awake ? ? ? ?Reviewed: ?Allergy & Precautions, NPO status , Patient's Chart, lab work & pertinent test results ? ?Airway ?Mallampati: II ? ?TM Distance: >3 FB ?Neck ROM: Full ? ? ? Dental ?no notable dental hx. ?(+) Upper Dentures, Dental Advisory Given ?  ?Pulmonary ?Current Smoker and Patient abstained from smoking.,  ?  ?Pulmonary exam normal ?breath sounds clear to auscultation ? ? ? ? ? ? Cardiovascular ?hypertension, Pt. on medications ?Normal cardiovascular exam ?Rhythm:Regular Rate:Normal ? ? ?  ?Neuro/Psych ?  ? GI/Hepatic ?(+)  ?  ? substance abuse ? alcohol use,   ?Endo/Other  ?negative endocrine ROS ? Renal/GU ?Renal diseaseLab Results ?     Component                Value               Date                 ?     CREATININE               1.26 (H)            09/16/2021           ?     BUN                      11                  09/16/2021           ?     NA                       139                 09/16/2021           ?     K                        4.2                 09/16/2021           ?     CL                       105                 09/16/2021           ?     CO2                      24                  09/16/2021           ?  ? ?  ?Musculoskeletal ?negative musculoskeletal ROS ?(+)  ? Abdominal ?  ?Peds ? Hematology ?negative hematology ROS ?(+) Lab Results ?     Component                Value               Date                 ?     WBC  12.8 (H)            09/16/2021           ?     HGB                      14.4                09/16/2021           ?     HCT                      44.6                09/16/2021           ?     MCV                      88.8                09/16/2021           ?     PLT                      277                 09/16/2021           ?   ?Anesthesia Other Findings ? ? Reproductive/Obstetrics ? ?  ? ? ? ? ? ? ? ? ? ? ? ? ? ?  ?   ? ? ? ? ? ? ? ?Anesthesia Physical ?Anesthesia Plan ? ?ASA: 2 ? ?Anesthesia Plan: Regional and MAC  ? ?Post-op Pain Management:   ? ?Induction:  ? ?PONV Risk Score and Plan: Treatment may vary due to age or medical condition, Midazolam and Ondansetron ? ?Airway Management Planned: Natural Airway and Nasal Cannula ? ?Additional Equipment: None ? ?Intra-op Plan:  ? ?Post-operative Plan:  ? ?Informed Consent: I have reviewed the patients History and Physical, chart, labs and discussed the procedure including the risks, benefits and alternatives for the proposed anesthesia with the patient or authorized representative who has indicated his/her understanding and acceptance.  ? ? ? ?Dental advisory given ? ?Plan Discussed with: CRNA and Anesthesiologist ? ?Anesthesia Plan Comments: (R Pop + adductor canal)  ? ? ? ? ? ?Anesthesia Quick Evaluation ? ?

## 2021-09-27 ENCOUNTER — Encounter (HOSPITAL_BASED_OUTPATIENT_CLINIC_OR_DEPARTMENT_OTHER)
Admission: RE | Disposition: A | Discharge status: Home / Self Care | Payer: Self-pay | Source: Home / Self Care | Attending: Orthopaedic Surgery

## 2021-09-27 ENCOUNTER — Other Ambulatory Visit: Payer: Self-pay

## 2021-09-27 ENCOUNTER — Ambulatory Visit (HOSPITAL_BASED_OUTPATIENT_CLINIC_OR_DEPARTMENT_OTHER): Payer: Medicaid Other | Admitting: Anesthesiology

## 2021-09-27 ENCOUNTER — Ambulatory Visit (HOSPITAL_BASED_OUTPATIENT_CLINIC_OR_DEPARTMENT_OTHER)
Admission: RE | Admit: 2021-09-27 | Discharge: 2021-09-27 | Disposition: A | Discharge status: Home / Self Care | Payer: Medicaid Other | Attending: Orthopaedic Surgery | Admitting: Orthopaedic Surgery

## 2021-09-27 ENCOUNTER — Encounter (HOSPITAL_BASED_OUTPATIENT_CLINIC_OR_DEPARTMENT_OTHER): Payer: Self-pay | Admitting: Orthopaedic Surgery

## 2021-09-27 ENCOUNTER — Ambulatory Visit (HOSPITAL_COMMUNITY): Payer: Medicaid Other

## 2021-09-27 DIAGNOSIS — S82851A Displaced trimalleolar fracture of right lower leg, initial encounter for closed fracture: Secondary | ICD-10-CM

## 2021-09-27 DIAGNOSIS — F17210 Nicotine dependence, cigarettes, uncomplicated: Secondary | ICD-10-CM | POA: Diagnosis not present

## 2021-09-27 DIAGNOSIS — I10 Essential (primary) hypertension: Secondary | ICD-10-CM

## 2021-09-27 DIAGNOSIS — W19XXXA Unspecified fall, initial encounter: Secondary | ICD-10-CM | POA: Diagnosis not present

## 2021-09-27 DIAGNOSIS — Z91199 Patient's noncompliance with other medical treatment and regimen due to unspecified reason: Secondary | ICD-10-CM | POA: Diagnosis not present

## 2021-09-27 HISTORY — PX: SYNDESMOSIS REPAIR: SHX5182

## 2021-09-27 HISTORY — DX: Essential (primary) hypertension: I10

## 2021-09-27 HISTORY — DX: Tremor, unspecified: R25.1

## 2021-09-27 HISTORY — PX: ORIF ANKLE FRACTURE: SHX5408

## 2021-09-27 SURGERY — OPEN REDUCTION INTERNAL FIXATION (ORIF) ANKLE FRACTURE
Anesthesia: Monitor Anesthesia Care | Site: Ankle | Laterality: Right

## 2021-09-27 MED ORDER — ONDANSETRON HCL 4 MG/2ML IJ SOLN: 4.0000 mg | Freq: Once | INTRAMUSCULAR | Status: DC | PRN

## 2021-09-27 MED ORDER — ASPIRIN 325 MG PO TABS
325.0000 mg | ORAL_TABLET | Freq: Every day | ORAL | 0 refills | Status: AC
Start: 2021-09-27 — End: 2021-10-27

## 2021-09-27 MED ORDER — ONDANSETRON HCL 4 MG/2ML IJ SOLN
INTRAMUSCULAR | Status: AC
  Filled 2021-09-27: qty 2

## 2021-09-27 MED ORDER — PROPOFOL 500 MG/50ML IV EMUL
INTRAVENOUS | Status: DC | PRN
  Administered 2021-09-27: 75 ug/kg/min via INTRAVENOUS

## 2021-09-27 MED ORDER — MIDAZOLAM HCL 5 MG/5ML IJ SOLN
INTRAMUSCULAR | Status: DC | PRN
  Administered 2021-09-27: 1 mg via INTRAVENOUS

## 2021-09-27 MED ORDER — VANCOMYCIN HCL 500 MG IV SOLR
INTRAVENOUS | Status: AC
  Filled 2021-09-27: qty 10

## 2021-09-27 MED ORDER — FENTANYL CITRATE (PF) 100 MCG/2ML IJ SOLN
50.0000 ug | Freq: Once | INTRAMUSCULAR | Status: AC
  Administered 2021-09-27: 50 ug via INTRAVENOUS

## 2021-09-27 MED ORDER — PROPOFOL 10 MG/ML IV BOLUS
INTRAVENOUS | Status: AC
  Filled 2021-09-27: qty 20

## 2021-09-27 MED ORDER — PHENYLEPHRINE HCL (PRESSORS) 10 MG/ML IV SOLN
INTRAVENOUS | Status: DC | PRN
  Administered 2021-09-27 (×3): 80 ug via INTRAVENOUS

## 2021-09-27 MED ORDER — CEFAZOLIN SODIUM-DEXTROSE 2-4 GM/100ML-% IV SOLN
INTRAVENOUS | Status: AC
  Filled 2021-09-27: qty 100

## 2021-09-27 MED ORDER — MIDAZOLAM HCL 2 MG/2ML IJ SOLN
1.0000 mg | Freq: Once | INTRAMUSCULAR | Status: AC
  Administered 2021-09-27: 1 mg via INTRAVENOUS

## 2021-09-27 MED ORDER — MIDAZOLAM HCL 2 MG/2ML IJ SOLN
INTRAMUSCULAR | Status: AC
Start: 2021-09-27 — End: ?
  Filled 2021-09-27: qty 2

## 2021-09-27 MED ORDER — SULFAMETHOXAZOLE-TRIMETHOPRIM 800-160 MG PO TABS: 1.0000 | ORAL_TABLET | Freq: Two times a day (BID) | ORAL | 0 refills | Status: AC

## 2021-09-27 MED ORDER — FENTANYL CITRATE (PF) 100 MCG/2ML IJ SOLN: 25.0000 ug | INTRAMUSCULAR | Status: DC | PRN

## 2021-09-27 MED ORDER — MIDAZOLAM HCL 2 MG/2ML IJ SOLN
INTRAMUSCULAR | Status: AC
  Filled 2021-09-27: qty 2

## 2021-09-27 MED ORDER — CEFAZOLIN SODIUM-DEXTROSE 2-4 GM/100ML-% IV SOLN
2.0000 g | INTRAVENOUS | Status: AC
  Administered 2021-09-27: 2 g via INTRAVENOUS

## 2021-09-27 MED ORDER — FENTANYL CITRATE (PF) 100 MCG/2ML IJ SOLN
INTRAMUSCULAR | Status: AC
  Filled 2021-09-27: qty 2

## 2021-09-27 MED ORDER — DEXAMETHASONE SODIUM PHOSPHATE 10 MG/ML IJ SOLN
INTRAMUSCULAR | Status: AC
  Filled 2021-09-27: qty 1

## 2021-09-27 MED ORDER — FENTANYL CITRATE (PF) 100 MCG/2ML IJ SOLN
INTRAMUSCULAR | Status: DC | PRN
  Administered 2021-09-27: 25 ug via INTRAVENOUS

## 2021-09-27 MED ORDER — PROPOFOL 10 MG/ML IV BOLUS
INTRAVENOUS | Status: DC | PRN
  Administered 2021-09-27 (×3): 20 mg via INTRAVENOUS

## 2021-09-27 MED ORDER — OXYCODONE HCL 5 MG PO TABS: 5.0000 mg | ORAL_TABLET | ORAL | 0 refills | Status: AC | PRN

## 2021-09-27 MED ORDER — VANCOMYCIN HCL 500 MG IV SOLR
INTRAVENOUS | Status: DC | PRN
  Administered 2021-09-27: 500 mg via TOPICAL

## 2021-09-27 MED ORDER — LACTATED RINGERS IV SOLN: INTRAVENOUS | Status: DC

## 2021-09-27 MED ORDER — 0.9 % SODIUM CHLORIDE (POUR BTL) OPTIME
TOPICAL | Status: DC | PRN
  Administered 2021-09-27: 200 mL

## 2021-09-27 MED ORDER — ACETAMINOPHEN 10 MG/ML IV SOLN: 1000.0000 mg | Freq: Once | INTRAVENOUS | Status: DC | PRN

## 2021-09-27 MED ORDER — ONDANSETRON HCL 4 MG/2ML IJ SOLN
INTRAMUSCULAR | Status: DC | PRN
  Administered 2021-09-27: 4 mg via INTRAVENOUS

## 2021-09-27 MED ORDER — EPHEDRINE SULFATE (PRESSORS) 50 MG/ML IJ SOLN
INTRAMUSCULAR | Status: DC | PRN
  Administered 2021-09-27: 10 mg via INTRAVENOUS
  Administered 2021-09-27: 5 mg via INTRAVENOUS

## 2021-09-27 SURGICAL SUPPLY — 67 items
APL PRP STRL LF DISP 70% ISPRP (MISCELLANEOUS) ×1
BANDAGE ESMARK 6X9 LF (GAUZE/BANDAGES/DRESSINGS) ×1 IMPLANT
BIT DRILL 2 CANN GRADUATED (BIT) ×1 IMPLANT
BIT DRILL 2.5 CANN STRL (BIT) ×1 IMPLANT
BIT DRILL 2.6 CANN (BIT) ×1 IMPLANT
BIT DRILL 3 CANN ENDOSCOPIC (BIT) ×1 IMPLANT
BLADE SURG 15 STRL LF DISP TIS (BLADE) ×2 IMPLANT
BLADE SURG 15 STRL SS (BLADE) ×8
BNDG CMPR 9X6 STRL LF SNTH (GAUZE/BANDAGES/DRESSINGS) ×1
BNDG ELASTIC 6X5.8 VLCR STR LF (GAUZE/BANDAGES/DRESSINGS) ×4 IMPLANT
BNDG ESMARK 6X9 LF (GAUZE/BANDAGES/DRESSINGS) ×2
CHLORAPREP W/TINT 26 (MISCELLANEOUS) ×2 IMPLANT
COVER BACK TABLE 60X90IN (DRAPES) ×2 IMPLANT
DRAPE C-ARM 42X72 X-RAY (DRAPES) ×2 IMPLANT
DRAPE C-ARMOR (DRAPES) ×2 IMPLANT
DRAPE EXTREMITY T 121X128X90 (DISPOSABLE) ×2 IMPLANT
DRAPE IMP U-DRAPE 54X76 (DRAPES) ×2 IMPLANT
DRAPE U-SHAPE 47X51 STRL (DRAPES) ×2 IMPLANT
DRSG PAD ABDOMINAL 8X10 ST (GAUZE/BANDAGES/DRESSINGS) ×2 IMPLANT
ELECT REM PT RETURN 9FT ADLT (ELECTROSURGICAL) ×2
ELECTRODE REM PT RTRN 9FT ADLT (ELECTROSURGICAL) ×1 IMPLANT
GAUZE SPONGE 4X4 12PLY STRL (GAUZE/BANDAGES/DRESSINGS) ×2 IMPLANT
GAUZE XEROFORM 5X9 LF (GAUZE/BANDAGES/DRESSINGS) ×2 IMPLANT
GLOVE BIOGEL M STRL SZ7.5 (GLOVE) ×4 IMPLANT
GLOVE BIOGEL PI IND STRL 8 (GLOVE) ×2 IMPLANT
GLOVE BIOGEL PI INDICATOR 8 (GLOVE) ×2
GOWN STRL REUS W/ TWL LRG LVL3 (GOWN DISPOSABLE) ×1 IMPLANT
GOWN STRL REUS W/ TWL XL LVL3 (GOWN DISPOSABLE) ×2 IMPLANT
GOWN STRL REUS W/TWL LRG LVL3 (GOWN DISPOSABLE) ×2
GOWN STRL REUS W/TWL XL LVL3 (GOWN DISPOSABLE) ×4
GUIDEWIRE 1.35MM (WIRE) ×2 IMPLANT
NS IRRIG 1000ML POUR BTL (IV SOLUTION) ×2 IMPLANT
PACK BASIN DAY SURGERY FS (CUSTOM PROCEDURE TRAY) ×2 IMPLANT
PAD CAST 4YDX4 CTTN HI CHSV (CAST SUPPLIES) ×1 IMPLANT
PADDING CAST COTTON 4X4 STRL (CAST SUPPLIES) ×2
PADDING CAST SYNTHETIC 4 (CAST SUPPLIES) ×4
PADDING CAST SYNTHETIC 4X4 STR (CAST SUPPLIES) ×2 IMPLANT
PENCIL SMOKE EVACUATOR (MISCELLANEOUS) ×2 IMPLANT
PLATE TITANIUM 6H RT FIB ANKLE (Plate) ×1 IMPLANT
SCREW ANKLE FT 3.5X60 (Screw) ×1 IMPLANT
SCREW CORT FT 3.5X56 (Screw) ×1 IMPLANT
SCREW CORTICAL 3MMX18MM (Screw) ×1 IMPLANT
SCREW LOCK 18X3XVALOPRFL (Screw) IMPLANT
SCREW LOCK TI QF 3X20 (Screw) ×2 IMPLANT
SCREW LOCKING 3.0X18 (Screw) ×2 IMPLANT
SCREW LP CORT 3.0X20 (Screw) ×1 IMPLANT
SCREW LP CORT 3.0X24 (Screw) ×1 IMPLANT
SCREW LP TI 3.5X14MM (Screw) ×3 IMPLANT
SCREW QCFIX CANN 4.0X44 (Screw) ×1 IMPLANT
SCREW QCKFIX CANN 4.0X40MM (Screw) ×1 IMPLANT
SHEET MEDIUM DRAPE 40X70 STRL (DRAPES) ×2 IMPLANT
SLEEVE SCD COMPRESS KNEE MED (STOCKING) ×2 IMPLANT
SPLINT FAST PLASTER 5X30 (CAST SUPPLIES) ×20
SPLINT PLASTER CAST FAST 5X30 (CAST SUPPLIES) ×20 IMPLANT
SPONGE T-LAP 18X18 ~~LOC~~+RFID (SPONGE) ×2 IMPLANT
STAPLER VISISTAT 35W (STAPLE) ×1 IMPLANT
STOCKINETTE 6  STRL (DRAPES) ×2
STOCKINETTE 6 STRL (DRAPES) ×1 IMPLANT
SUCTION FRAZIER HANDLE 10FR (MISCELLANEOUS) ×2
SUCTION TUBE FRAZIER 10FR DISP (MISCELLANEOUS) ×1 IMPLANT
SUT ETHILON 3 0 PS 1 (SUTURE) ×4 IMPLANT
SUT MNCRL AB 3-0 PS2 18 (SUTURE) ×3 IMPLANT
SUT PDS AB 2-0 CT2 27 (SUTURE) ×2 IMPLANT
SYR BULB EAR ULCER 3OZ GRN STR (SYRINGE) ×2 IMPLANT
TOWEL GREEN STERILE FF (TOWEL DISPOSABLE) ×4 IMPLANT
TUBE CONNECTING 20X1/4 (TUBING) ×2 IMPLANT
UNDERPAD 30X36 HEAVY ABSORB (UNDERPADS AND DIAPERS) ×2 IMPLANT

## 2021-09-27 NOTE — Progress Notes (Signed)
Assisted Dr. Valma Cava with right, popliteal/saphenous, ultrasound guided block. Side rails up, monitors on throughout procedure. See vital signs in flow sheet. Tolerated Procedure well. ?

## 2021-09-27 NOTE — Discharge Instructions (Addendum)
[facility] POST-OP INSTRUCTIONS   Pain Management The numbing medicine and your leg will last around 18 hours, take a dose of your pain medicine as soon as you feel it wearing off to avoid rebound pain. Keep your foot elevated above heart level.  Make sure that your heel hangs free ('floats'). Take all prescribed medication as directed. If taking narcotic pain medication you may want to use an over-the-counter stool softener to avoid constipation. You may take over-the-counter NSAIDs (ibuprofen, naproxen, etc.) as well as over-the-counter acetaminophen as directed on the packaging as a supplement for your pain and may also use it to wean away from the prescription medication.  Activity Non-weightbearing Keep splint intact  First Postoperative Visit Your first postop visit will be at least 2 weeks after surgery.  This should be scheduled when you schedule surgery. If you do not have a postoperative visit scheduled please call [phone] to schedule an appointment. At the appointment your incision will be evaluated for suture removal, x-rays will be obtained if necessary.  General Instructions Swelling is very common after foot and ankle surgery.  It often takes 3 months for the foot and ankle to begin to feel comfortable.  Some amount of swelling will persist for 6-12 months. DO NOT change the dressing.  If there is a problem with the dressing (too tight, loose, gets wet, etc.) please contact Dr. Adair's office. DO NOT get the dressing wet.  For showers you can use an over-the-counter cast cover or wrap a washcloth around the top of your dressing and then cover it with a plastic bag and tape it to your leg. DO NOT soak the incision (no tubs, pools, bath, etc.) until you have approval from Dr. Adair.  Contact Dr. Adairs office or go to Emergency Room if: Temperature above 101 F. Increasing pain that is unresponsive to pain medication or elevation Excessive redness or  swelling in your foot Dressing problems - excessive bloody drainage, looseness or tightness, or if dressing gets wet Develop pain, swelling, warmth, or discoloration of your calf   Post Anesthesia Home Care Instructions  Activity: Get plenty of rest for the remainder of the day. A responsible individual must stay with you for 24 hours following the procedure.  For the next 24 hours, DO NOT: -Drive a car -Operate machinery -Drink alcoholic beverages -Take any medication unless instructed by your physician -Make any legal decisions or sign important papers.  Meals: Start with liquid foods such as gelatin or soup. Progress to regular foods as tolerated. Avoid greasy, spicy, heavy foods. If nausea and/or vomiting occur, drink only clear liquids until the nausea and/or vomiting subsides. Call your physician if vomiting continues.  Special Instructions/Symptoms: Your throat may feel dry or sore from the anesthesia or the breathing tube placed in your throat during surgery. If this causes discomfort, gargle with warm salt water. The discomfort should disappear within 24 hours.  If you had a scopolamine patch placed behind your ear for the management of post- operative nausea and/or vomiting:  1. The medication in the patch is effective for 72 hours, after which it should be removed.  Wrap patch in a tissue and discard in the trash. Wash hands thoroughly with soap and water. 2. You may remove the patch earlier than 72 hours if you experience unpleasant side effects which may include dry mouth, dizziness or visual disturbances. 3. Avoid touching the patch. Wash your hands with soap and water after contact with the   patch.  Regional Anesthesia Blocks  1. Numbness or the inability to move the "blocked" extremity may last from 3-48 hours after placement. The length of time depends on the medication injected and your individual response to the medication. If the numbness is not going away after 48  hours, call your surgeon.  2. The extremity that is blocked will need to be protected until the numbness is gone and the  Strength has returned. Because you cannot feel it, you will need to take extra care to avoid injury. Because it may be weak, you may have difficulty moving it or using it. You may not know what position it is in without looking at it while the block is in effect.  3. For blocks in the legs and feet, returning to weight bearing and walking needs to be done carefully. You will need to wait until the numbness is entirely gone and the strength has returned. You should be able to move your leg and foot normally before you try and bear weight or walk. You will need someone to be with you when you first try to ensure you do not fall and possibly risk injury.  4. Bruising and tenderness at the needle site are common side effects and will resolve in a few days.  5. Persistent numbness or new problems with movement should be communicated to the surgeon or the [facility] ([phone])/ [facility] ([phone]).      

## 2021-09-27 NOTE — Progress Notes (Signed)
Family in phase 2 D/C and very upset they did not get to talk with Dr. Lucia Gaskins after surgery.  Family stated they did not stay in the building the whole time while pt was in surgery.  Family also stated they did not receive a call from Dr. Lucia Gaskins.  RN tried several attempts to call Dr. Lucia Gaskins and his PA.  Dr. Pollie Friar office stated he was in another surgery at this time.  RN left a message with Dr. Pollie Friar office and his personal cell phone to call the pt.   ?

## 2021-09-27 NOTE — Anesthesia Procedure Notes (Signed)
Anesthesia Regional Block: Adductor canal block  ? ?Pre-Anesthetic Checklist: , timeout performed,  Correct Patient, Correct Site, Correct Laterality,  Correct Procedure, Correct Position, site marked,  Risks and benefits discussed,  Surgical consent,  Pre-op evaluation,  At surgeon's request and post-op pain management ? ?Laterality: Lower and Right ? ?Prep: chloraprep     ?  ?Needles:  ?Injection technique: Single-shot ? ?Needle Type: Echogenic Needle   ? ? ?Needle Length: 9cm  ?Needle Gauge: 22  ? ? ? ?Additional Needles: ? ? ?Procedures:,,,, ultrasound used (permanent image in chart),,    ?Narrative:  ?Start time: 09/27/2021 11:46 AM ?End time: 09/27/2021 11:50 AM ?Injection made incrementally with aspirations every 5 mL. ? ?Performed by: Personally  ?Anesthesiologist: Barnet Glasgow, MD ? ?Additional Notes: ?Block assessed prior to surgery. Pt tolerated procedure well. ? ? ? ? ?

## 2021-09-27 NOTE — H&P (Signed)
PREOPERATIVE H&P ? ?Chief Complaint: Right trimalleolar ankle fracture dislocation ? ?HPI: ?Brent Ramos is a 66 y.o. male who presents for preoperative history and physical with a diagnosis of right trimalleolar ankle fracture dislocation.  He sustained this after a fall.  He was seen in the emergency department where he underwent closed reduction and splinting.  He was seen in my office after that where discussion about surgery was had.  He is here today for surgery.  Patient states he has been walking on his leg in the splint despite medical advice to avoid weightbearing.  He has been noncompliant with that.  He complains of pain in the right ankle.  Denies other joint or extremity pain.. Symptoms are rated as moderate to severe, and have been worsening.  This is significantly impairing activities of daily living.  He has elected for surgical management.  ? ?Past Medical History:  ?Diagnosis Date  ? Alcohol abuse   ? Hypertension   ? Memory loss   ? Seizures ([place])   ? "has had them for years" (02/05/2017)  ? Tremors of nervous system   ? Vision abnormalities   ? ?Past Surgical History:  ?Procedure Laterality Date  ? NEPHRECTOMY LIVING DONOR  1970s  ? ?Social History  ? ?Socioeconomic History  ? Marital status: Divorced  ?  Spouse name: Not on file  ? Number of children: Not on file  ? Years of education: Not on file  ? Highest education level: Not on file  ?Occupational History  ? Not on file  ?Tobacco Use  ? Smoking status: Every Day  ?  Packs/day: 0.50  ?  Years: 45.00  ?  Pack years: 22.50  ?  Types: Cigarettes  ? Smokeless tobacco: Former  ?  Types: Chew  ?Vaping Use  ? Vaping Use: Never used  ?Substance and Sexual Activity  ? Alcohol use: Yes  ?  Comment: "had one last night"  ? Drug use: No  ? Sexual activity: Never  ?Other Topics Concern  ? Not on file  ?Social History Narrative  ? Pt lives in 1 story home with his friend/caretaker, [name]  ? Has no children  ? Highest level of education: 11th grade  ?  Unemployed - currently on SSI  ? Right handed  ? Drinks caffeine  ? ?Social Determinants of Health  ? ?Financial Resource Strain: Not on file  ?Food Insecurity: Not on file  ?Transportation Needs: Not on file  ?Physical Activity: Not on file  ?Stress: Not on file  ?Social Connections: Not on file  ? ?Family History  ?Problem Relation Age of Onset  ? Dementia Mother   ? ?No Known Allergies ?Prior to Admission medications   ?Medication Sig Start Date End Date Taking? Authorizing Provider  ?gabapentin (NEURONTIN) 100 MG capsule Take 1 capsule every night for 1 week, then increase to 1 capsule twice a day 07/11/21  Yes Cameron Sprang, MD  ?HYDROcodone-acetaminophen (NORCO) 10-325 MG tablet Take 1 tablet by mouth every 6 (six) hours as needed. 09/19/21  Yes   ?HYDROcodone-acetaminophen (NORCO/VICODIN) 5-325 MG tablet Take 1 tablet by mouth every 6 (six) hours as needed for severe pain. 09/16/21  Yes Truddie Hidden, MD  ?levETIRAcetam (KEPPRA) 750 MG tablet TAKE 1 TABLET(750 MG) BY MOUTH TWICE DAILY 07/11/21  Yes Cameron Sprang, MD  ?lisinopril (ZESTRIL) 40 MG tablet Take 40 mg by mouth daily. 11/30/17  Yes [provider]  ?OLANZapine (ZYPREXA) 5 MG tablet Take 1 tablet every  other day 07/11/21  Yes Cameron Sprang, MD  ?Misc. Devices [facility]) MISC 1 each by Does not apply route daily in the afternoon. 09/16/21   Truddie Hidden, MD  ? ? ? ?Positive ROS: All other systems have been reviewed and were otherwise negative with the exception of those mentioned in the HPI and as above. ? ?Physical Exam:  ?Vitals:  ? 09/27/21 1155 09/27/21 1200  ?BP: 108/65 106/73  ?Pulse: 73 70  ?Resp: 16 15  ?Temp:    ?SpO2: 94% 95%  ? ?General: Alert, no acute distress ?Cardiovascular: No pedal edema ?Respiratory: No cyanosis, no use of accessory musculature ?GI: No organomegaly, abdomen is soft and non-tender ?Skin: No lesions in the area of chief complaint ?Neurologic: Sensation intact distally ?Psychiatric: Patient is  competent for consent with normal mood and affect ?Lymphatic: No axillary or cervical lymphadenopathy ? ?MUSCULOSKELETAL: Right ankle out of the splint demonstrates fracture blistering laterally and medially.  He has a 2 x 2 centimeter eschar overlying the medial malleolus.  No open wound.  There is valgus deformity and swelling about the ankle.  He endorses sensation to light touch.  Foot is warm and well-perfused. ? ?Assessment: ?Right trimalleolar ankle fracture dislocation ?Noncompliance with medial 2 x 2 centimeter eschar that may require surgical excision ? ? ?Plan: ?Plan for open treatment of his fracture.  We will try to excise the eschar to have more appropriate skin healing there.  I did stress the importance of maintaining nonweightbearing given the injury pattern.  He voiced understanding but says he is having difficulty with that.  We will plan to proceed with surgery but he does also understand he is at increased risk of infection and wound healing complications as well as issues with healing of the bone if he is noncompliant.. ? ?We discussed the risks, benefits and alternatives of surgery which include but are not limited to wound healing complications, infection, nonunion, malunion, need for further surgery, damage to surrounding structures and continued pain.  They understand there is no guarantees to an acceptable outcome.  After weighing these risks they opted to proceed with surgery. ? ? ? ? ?Erle Crocker, MD ? ? ? ?09/27/2021 ?12:43 PM  ? ?

## 2021-09-27 NOTE — Anesthesia Procedure Notes (Signed)
Procedure Name: [place] ?Date/Time: 09/27/2021 1:06 PM ?Performed by: Glory Buff, CRNA ?Pre-anesthesia Checklist: Patient identified, Emergency Drugs available and Suction available ?Patient Re-evaluated:Patient Re-evaluated prior to induction ?Oxygen Delivery Method: Simple face mask ? ? ? ? ?

## 2021-09-27 NOTE — Anesthesia Postprocedure Evaluation (Signed)
Anesthesia Post Note ? ?Patient: Brent Ramos ? ?Procedure(s) Performed: OPEN TREATMENT OF RIGHT TRIMALLEOLAR ANKLE FRACTURE (Right: Ankle) ?SYNDESMOSIS (Right: Ankle) ? ?  ? ?Patient location during evaluation: PACU ?Anesthesia Type: Regional ?Level of consciousness: awake and alert ?Pain management: pain level controlled ?Vital Signs Assessment: post-procedure vital signs reviewed and stable ?Respiratory status: spontaneous breathing, nonlabored ventilation, respiratory function stable and patient connected to nasal cannula oxygen ?Cardiovascular status: stable and blood pressure returned to baseline ?Postop Assessment: no apparent nausea or vomiting ?Anesthetic complications: no ? ? ?No notable events documented. ? ?Last Vitals:  ?Vitals:  ? 09/27/21 1515 09/27/21 1525  ?BP: 106/68 111/74  ?Pulse: 70 75  ?Resp: 15 14  ?Temp:    ?SpO2: 97% 98%  ?  ?Last Pain:  ?Vitals:  ? 09/27/21 1525  ?TempSrc:   ?PainSc: 0-No pain  ? ? ?  ?  ?RLE Motor Response: No movement due to regional block (09/27/21 1525) ?RLE Sensation: No pain;Numbness (09/27/21 1525) ?  ?  ? ?Barnet Glasgow ? ? ? ? ?

## 2021-09-27 NOTE — Transfer of Care (Signed)
Immediate Anesthesia Transfer of Care Note ? ?Patient: Brent Ramos ? ?Procedure(s) Performed: OPEN TREATMENT OF RIGHT TRIMALLEOLAR ANKLE FRACTURE (Right: Ankle) ?SYNDESMOSIS (Right: Ankle) ? ?Patient Location: PACU ? ?Anesthesia Type:MAC and Regional ? ?Level of Consciousness: drowsy and patient cooperative ? ?Airway & Oxygen Therapy: Patient Spontanous Breathing and Patient connected to face mask oxygen ? ?Post-op Assessment: Report given to RN and Post -op Vital signs reviewed and stable ? ?Post vital signs: Reviewed and stable ? ?Last Vitals:  ?Vitals Value Taken Time  ?BP    ?Temp    ?Pulse 66 09/27/21 1447  ?Resp 16 09/27/21 1447  ?SpO2 98 % 09/27/21 1447  ?Vitals shown include unvalidated device data. ? ?Last Pain:  ?Vitals:  ? 09/27/21 1059  ?TempSrc: Oral  ?PainSc: 0-No pain  ?   ? ?Patients Stated Pain Goal: 3 (09/27/21 1059) ? ?Complications: No notable events documented. ?

## 2021-09-27 NOTE — Anesthesia Procedure Notes (Signed)
Anesthesia Regional Block: Popliteal block  ? ?Pre-Anesthetic Checklist: , timeout performed,  Correct Patient, Correct Site, Correct Laterality,  Correct Procedure, Correct Position, site marked,  Risks and benefits discussed,  Pre-op evaluation,  At surgeon's request and post-op pain management ? ?Laterality: Lower and Right ? ?Prep: Maximum Sterile Barrier Precautions used, chloraprep     ?  ?Needles:  ?Injection technique: Single-shot ? ?Needle Type: Echogenic Needle   ? ? ?Needle Length: 9cm  ?Needle Gauge: 21  ? ? ? ?Additional Needles: ? ? ?Procedures:,,,, ultrasound used (permanent image in chart),,    ?Narrative:  ?Start time: 09/27/2021 11:40 AM ?End time: 09/27/2021 11:48 AM ?Injection made incrementally with aspirations every 5 mL. ? ?Performed by: Personally  ?Anesthesiologist: Barnet Glasgow, MD ? ?Additional Notes: ?Block assessed. Patient tolerated procedure well. ? ? ? ? ?

## 2021-09-28 ENCOUNTER — Encounter (HOSPITAL_BASED_OUTPATIENT_CLINIC_OR_DEPARTMENT_OTHER): Payer: Self-pay | Admitting: Orthopaedic Surgery

## 2021-09-28 NOTE — Op Note (Signed)
?BRANDONLEE NAVIS ?male ?66 y.o. ?09/27/2021 ? ?PreOperative Diagnosis: ?Right trimalleolar ankle fracture ?Syndesmosis disruption ? ?PostOperative Diagnosis: ?Right trimalleolar ankle fracture ?Syndesmosis disruption ? ?PROCEDURE: ?ORIF right trimalleolar ankle fracture ?Open treatment of syndesmosis ?Ankle stress view fluoroscopy ? ?SURGEON: Melony Overly, MD ? ?ASSISTANT: Jesse Martinique, PA-C: His assistance was necessary for prep and drape, exposure, holding retractors, maintaining and fixing fracture reduction, wound closure and splinting.  ? ?ANESTHESIA: General LMA with peripheral nerve block ? ?FINDINGS: ?See below ? ?IMPLANTS: ?Distal fibular locking plate, Arthrex ?4.0 mm partially threaded screws ? ?INDICATIONS:65 y.o. malesustained the above injury after a fall.  He had a fracture dislocation of his ankle and underwent closed reduction in the emergency department.  He was sent to my office for evaluation.  Given the nature of his fracture and baseline ambulatory status he was indicated for surgery.   In the preoperative holding area the patient was noted after splint rolled to have significant amount of fracture blistering, ecchymosis and a 2 x 2 cm eschar overlying the medial malleolus.  Patient had admitted to walking on the leg and the splint despite medical advice to not be weightbearing.  Patient understood the risks, benefits and alternatives to surgery which include but are not limited to wound healing complications, infection, nonunion, malunion, need for further surgery as well as damage to surrounding structures. They also understood the potential for continued pain in that there were no guarantees of acceptable outcome.  We stressed the risks involved with surgery given his noncompliance and eschar overlying the medial malleolus.  He wished to proceed with surgery and understood the risks.  After weighing these risks the patient opted to proceed with surgery. ? ?PROCEDURE: ?Patient was  identified in the preoperative holding area.  The right leg was marked by myself.  Consent was signed by myself and the patient.  Block was performed by anesthesia in the preoperative holding area.  Patient was taken to the operative suite and placed supine on the operative table.  General LMA anesthesia was induced without difficulty. Bump was placed under the operative hip and bone foam was used.  All bony prominences were well padded.  Tourniquet was placed on the operative thigh.  Preoperative antibiotics were given. The extremity was prepped and draped in the usual sterile fashion and surgical timeout was performed.  The limb was elevated and the tourniquet was inflated to 250 mmHg. ? ?We began by making a longitudinal incision overlying the fibula.  This was taken sharply down through skin and subcutaneous tissue.  Blunt dissection was used to identify any branch of the superficial peroneal nerve that was identified and protected through the entirety of the case.  The incision was then taken sharply down to bone and the fracture site was identified.  The fracture site was mobilized.   The fracture site were cleaned with a rondure and curette of any fracture hematoma and callus formation.  Then the fracture of the fibula was reduced under direct visualization and held provisionally with a lobster claw.  Then fluoroscopy confirmed adequate reduction of the ankle mortise at that time.  Then a one third tubular plate was placed across the fracture site for fixation.  This provided good stability of the distal fibula fracture.   ? ?We then turned our attention to the medial malleolus.  There was continued displacement of the medial malleolus and therefore an incision was made overlying this.  This was taken sharply down through skin and subcutaneous tissue.  Bovie  cautery was used for skin bleeders.  Then sharp dissection down to the fracture and the fracture site was identified.  The soft tissue flap was carried  anteriorly to identify the medial gutter of the ankle joint.  Then the fracture site was mobilized and using a curette and rondure the fracture was cleared out of hematoma and fracture callus.  Then the fracture was reduced and held provisionally with a pointed reduction forcep.  This was done under direct visualization.  Then fluoroscopy confirmed adequate reduction.  2 4.0 mm partially-threaded cannulated screws were placed across the fracture site with good fixation.   ? ?We then proceeded with stress view fluoroscopy of the ankle.  It was noted on stress views that there is widening of the syndesmosis and medial clear space.  We then proceeded with open treatment of the syndesmosis. ? ?Separate deep incision was created distally along the level of the syndesmosis.  We would gain access to the syndesmosis and the ligaments had been torn.  The syndesmosis was in cleared of any interposed tissue using a rongeur and a Soil scientist.  Then the syndesmosis was reduced under direct position and held provisionally with a Weber clamp.  Fluoroscopy confirmed appropriate position of the syndesmosis and closing down the mortise.  We then proceeded placed 2 fully threaded screws through the plate into the medial cortex of the tibia.  This provided good stability of the syndesmosis.  Postplacement stress views were stable. ? ?Then lateral x-rays were obtained in the posterior malleolar fragment appeared to be well reduced and not amenable to internal fixation. ? ?Final films were obtained. ? ?The wounds were irrigated and the subcuticular tissue was closed with 3-0 Monocryl and the skin with nylon.  Xeroform was placed on the wounds and fracture blistered areas as well as 4 x 4's and sterile sheet cotton.  The tourniquet was released.   ? ?Patient was placed in a nonweightbearing short leg splint.  Patient tolerated the procedure well.  There were no complications.  Patient was awakened from anesthesia and taken recovery in  stable condition. ? ?POST OPERATIVE INSTRUCTIONS: ?Nonweightbearing on operative extremity ?Keep splint dry and limb elevated ?Call the office with concerns ?Follow-up in 2 weeks for splint removal, x-rays of the operative ankle, nonweightbearing and suture removal if appropriate.   ? ?TOURNIQUET TIME:some 2 hours ? ?BLOOD LOSS:  Minimal ?        ?DRAINS: none ?        ?SPECIMEN: none ?      ?COMPLICATIONS:  * No complications entered in OR log * ?        ?Disposition: PACU - hemodynamically stable. ?        ?Condition: stable  ?

## 2021-12-13 ENCOUNTER — Encounter: Payer: Self-pay | Admitting: Neurology

## 2021-12-13 ENCOUNTER — Telehealth (INDEPENDENT_AMBULATORY_CARE_PROVIDER_SITE_OTHER): Payer: Medicaid Other | Admitting: Neurology

## 2021-12-13 VITALS — Ht 74.0 in | Wt 250.0 lb

## 2021-12-13 DIAGNOSIS — G40009 Localization-related (focal) (partial) idiopathic epilepsy and epileptic syndromes with seizures of localized onset, not intractable, without status epilepticus: Secondary | ICD-10-CM

## 2021-12-13 DIAGNOSIS — R251 Tremor, unspecified: Secondary | ICD-10-CM

## 2021-12-13 NOTE — Progress Notes (Signed)
Virtual Visit via Video Note The purpose of this virtual visit is to provide medical care while limiting exposure to the novel coronavirus.    Consent was obtained for video visit:  Yes.   Answered questions that patient had about telehealth interaction:  Yes.   I discussed the limitations, risks, security and privacy concerns of performing an evaluation and management service by telemedicine. I also discussed with the patient that there may be a patient responsible charge related to this service. The patient expressed understanding and agreed to proceed.  Pt location: Home Physician Location: office Name of referring provider:  Elwyn Reach, MD I connected with Brent Ramos at patients initiation/request on 12/13/2021 at  2:00 PM EDT by video enabled telemedicine application and verified that I am speaking with the correct person using two identifiers. Pt MRN:  [id number] Pt DOB:  Aug 27, 1955 Video Participants:  Brent Ramos;  [name] (POA)   History of Present Illness: The patient had a virtual video visit on 12/13/2021. He was last seen in the neurology clinic 6 months ago for seizures and tremors. His POA and aide Brent Ramos is again present to provide additional information. Since his last visit, they deny any seizures since 12/2020. He is on Levetiracetam '750mg'$  BID. He was started on low dose gabapentin '100mg'$  BID last February for tremors (orthostatic tremor). They feel that it did start working better for his tremors, however he then had a fall in May fracturing his right ankle. He is still recovering from the surgery. His EtOH level when he fell was 102. He is on olazapine '5mg'$  every other day, he had post-ictal psychosis with prior seizure and has been taking olanzapine since then, sometimes he may take an additional dose when needed. No side effects on medications.    History on Initial Assessment 05/30/2017: This is a pleasant 66 year old right-handed man with a history of  alcohol abuse, seizures, presenting to establish care. He is accompanied by his POA and roommate Brent Ramos, who provides majority of the history. They report that seizures started in his 25s. He has no prior warning before the seizures. Brent Ramos has known him for 12 years and has witnessed several seizures. He would start staring off, answers "what" but does not follow commands, followed by left head deviation then 20 seconds later a generalized convulsion. Sometimes he has clusters of 4-5 seizures lasting 1-2 minutes at a time. He had been taking Dilantin since his 61s, up to 7-8 months ago when he started having balance issues and "jerkiness." Brent Ramos noted that when he stands, he would be rocking and shaking when standing still. He has jerking in sleep that wakes him up. He was switched to Keppra, and this seemed to help better. He was in the ER in May 2018 for seizures in the setting of alcohol abuse, alcohol level 52. Brent Ramos reports that the pharmacy did not deliver [place] last September and he was off medication for a whole weekend then started having seizures. He was in the ER on 01/31/17 (per ER notes he was out of medication for 2 months due to finances). CT head showed mild atrophy, no acute changes. He was given a prescription for Keppra and discharged, then was back 3 days later after another seizure. He was then admitted on 02/05/17 after he had 6 witnessed seizures with rhabdomyolysis (CK >9000). His EEG at that time showed excess diffuse beta activity, no epileptiform discharges. He was discharged home, however Brent Ramos reports that  once he got home, he was awake for 4-5 days, saying he would clean the room but pulled things out of drawers and left them askew. He was hallucinating and wandered off, Brent Ramos had to call Missing Persons 6 times, until finally the patient asked the police to take him to the ER on 02/20/17.  While in the ER, he was noted to have an episode of unresponsiveness. He was evaluated by  [facility] and started on Zyprexa. Brent Ramos has known him for 12 years and has never seen any manic behavior until that time, but states "he has always been mean and agitated," but has significantly improved on Zyprexa. He stopped drinking in August per Brent Ramos. He used to drink 2-3 18-pack of beer within 3 days. He is taking Keppra '500mg'$  BID without side effects, no seizures since September 2018.    He used to have a lot of headaches, but these seemed to ease up since starting the [place]. He occasionally gets dizzy upon standing. 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 He has occasional tingling in both hands and stinging on both feet. They got cold quickly. He has a lot of back pain. No bowel/bladder dysfunction. He denies any olfactory/gustatory hallucinations, deja vu, rising epigastric sensation, myoclonic jerks. Brent Ramos reports that the patient has "always been a little off" since he has known him. He needs things repeated for him. Brent Ramos has always been in charge of bills. He does not drive. Over the past month, the patient has been taking his own medications, Brent Ramos ensures he takes them.    Epilepsy Risk Factors:  He had a normal birth and early development.  There is no history of febrile convulsions, CNS infections such as meningitis/encephalitis, significant traumatic brain injury, neurosurgical procedures, or family history of seizures.  I personally reviewed head CT without contrast done 10/2019 which did not show any acute changes. 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    Current Outpatient Medications on File Prior to Visit  Medication Sig Dispense Refill   gabapentin (NEURONTIN) 100 MG capsule Take 1 capsule every night for 1 week, then increase to 1 capsule twice a day 60 capsule 11   levETIRAcetam (KEPPRA) 750 MG tablet TAKE 1 TABLET(750 MG) BY MOUTH TWICE DAILY 180 tablet 3   lisinopril (ZESTRIL)  40 MG tablet Take 40 mg by mouth daily.  0   Misc. Devices [facility]) MISC 1 each by Does not apply route daily in the afternoon. 1 each 0   OLANZapine (ZYPREXA) 5 MG tablet Take 1 tablet every other day 45 tablet 3   No current facility-administered medications on file prior to visit.     Observations/Objective:   Vitals:   12/13/21 1307  Weight: 250 lb (113.4 kg)  Height: '6\' 2"'$  (1.88 m)   GEN:  The patient appears stated age and is in NAD.  Neurological examination: Patient is awake, alert. No aphasia or dysarthria. Intact fluency and comprehension. Cranial nerves: Right eye closed (phthisis bulbi). Extraocular movements intact on left eye. No facial asymmetry. Motor: moves all extremities symmetrically, at least anti-gravity x 4.    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 Head CT unremarkable. His 1-hour EEG was normal. He had been seizure-free for 4 years until he had  a seizure on 12/30/20, now on Levetiracetam '750mg'$  BID. No further seizures since then. They feel there was some improvement in the leg tremors with initiation of gabapentin '100mg'$  BID, however he had a fall and fractured his right ankle. Continue follow-up with Ortho and monitor tremors as he starts increasing ambulation again. He has olanzapine '5mg'$  every other day which helped with post-ictal psychosis initially, as well as sleep. We again discussed minimizing alcohol use. He does not drive. Continue 24/7 care. Follow-up in 6 months, call for any changes.     Follow Up Instructions:   -I discussed the assessment and treatment plan with the patient. The patient was provided an opportunity to ask questions and all were answered. The patient agreed with the plan and demonstrated an understanding of the instructions.   The patient was advised to call back or seek  an in-person evaluation if the symptoms worsen or if the condition fails to improve as anticipated.     Cameron Sprang, MD

## 2021-12-13 NOTE — Patient Instructions (Signed)
Good to see you.  Continue Keppra '750mg'$  twice a day  2. Continue Gabapentin '100mg'$  twice a day  3. Continue Olanzapine '5mg'$  every other day  4. Minimize alcohol intake  5. Follow-up in 6 months, call for any changes.    Seizure Precautions: 1. If medication has been prescribed for you to prevent seizures, take it exactly as directed.  Do not stop taking the medicine without talking to your doctor first, even if you have not had a seizure in a long time.   2. Avoid activities in which a seizure would cause danger to yourself or to others.  Don't operate dangerous machinery, swim alone, or climb in high or dangerous places, such as on ladders, roofs, or girders.  Do not drive unless your doctor says you may.  3. If you have any warning that you may have a seizure, lay down in a safe place where you can't hurt yourself.    4.  No driving for 6 months from last seizure, as per [facility].   Please refer to the following link on the [facility] website for more information: http://www.epilepsyfoundation.org/answerplace/Social/driving/drivingu.cfm   5.  Maintain good sleep hygiene.  6.  Contact your doctor if you have any problems that may be related to the medicine you are taking.  7.  Call 911 and bring the patient back to the ED if:        A.  The seizure lasts longer than 5 minutes.       B.  The patient doesn't awaken shortly after the seizure  C.  The patient has new problems such as difficulty seeing, speaking or moving  D.  The patient was injured during the seizure  E.  The patient has a temperature over 102 F (39C)  F.  The patient vomited and now is having trouble breathing

## 2022-05-20 ENCOUNTER — Other Ambulatory Visit: Payer: Self-pay | Admitting: Neurology

## 2022-05-22 ENCOUNTER — Telehealth: Payer: Self-pay | Admitting: Neurology

## 2022-05-22 ENCOUNTER — Telehealth: Payer: Self-pay | Admitting: Anesthesiology

## 2022-05-22 MED ORDER — OLANZAPINE 5 MG PO TABS: ORAL_TABLET | ORAL | 3 refills | Status: DC

## 2022-05-22 NOTE — Telephone Encounter (Signed)
Pt's friend called in stating the dosage sent in to the pharmacy earlier today was too low. He had to go back to his original dosage of 1 tablet twice a day. This is why he is running out.

## 2022-05-22 NOTE — Telephone Encounter (Signed)
They are asking if we can send in a new script for the Olanzapine 5 mg for 1 tablet BID

## 2022-05-22 NOTE — Telephone Encounter (Signed)
Pt left message stating he needs a refill on his medication states he took it differently than prescribed so he ran out of medication  1. Which medications need refilled? Olanzapine 5 mg  2. Which pharmacy/location is medication to be sent to? [facility] on [address]  3. Do they need a 30 day or 90 day supply? 90 day supply

## 2022-05-22 NOTE — Telephone Encounter (Signed)
Rx sent, thanks 

## 2022-05-24 NOTE — Telephone Encounter (Signed)
Pt's friend called back in to get an update on the increased prescription for the Olanzapine

## 2022-05-24 NOTE — Telephone Encounter (Signed)
Pls check what symptoms he is having for him to need the increase in dose, he was previously only taking it every other day, now needs it BID? What has changed? Thanks

## 2022-05-25 MED ORDER — OLANZAPINE 5 MG PO TABS: ORAL_TABLET | ORAL | 6 refills | Status: DC

## 2022-05-25 NOTE — Telephone Encounter (Signed)
Pt called an informed that No need for visit, just needed clarification on why he is needing higher dose. Hopefully this is temporary as stress goes down a new RX was send in for him

## 2022-05-25 NOTE — Telephone Encounter (Signed)
Pt sated that he was stressed over the holidays. He has had an increase in stress the past month. They have had trouble with their house, having mold under it, having trouble with the water, and just the stress of the holidays. They said that if needed Batu could do a VV? He is out of medication and really needs it.

## 2022-05-25 NOTE — Telephone Encounter (Signed)
No need for visit, just needed clarification on why he is needing higher dose. Hopefully this is temporary as stress goes down. Rx sent for olanzapine '5mg'$  BID, thanks

## 2022-07-11 ENCOUNTER — Encounter: Payer: Self-pay | Admitting: Neurology

## 2022-07-11 ENCOUNTER — Ambulatory Visit (INDEPENDENT_AMBULATORY_CARE_PROVIDER_SITE_OTHER): Payer: 59 | Admitting: Neurology

## 2022-07-11 VITALS — BP 129/82 | HR 80 | Ht 74.0 in | Wt 252.7 lb

## 2022-07-11 DIAGNOSIS — R251 Tremor, unspecified: Secondary | ICD-10-CM

## 2022-07-11 DIAGNOSIS — G40009 Localization-related (focal) (partial) idiopathic epilepsy and epileptic syndromes with seizures of localized onset, not intractable, without status epilepticus: Secondary | ICD-10-CM

## 2022-07-11 MED ORDER — GABAPENTIN 100 MG PO CAPS: ORAL_CAPSULE | ORAL | 3 refills | Status: DC

## 2022-07-11 MED ORDER — OLANZAPINE 5 MG PO TABS: ORAL_TABLET | ORAL | 3 refills | Status: DC

## 2022-07-11 MED ORDER — LEVETIRACETAM 750 MG PO TABS: ORAL_TABLET | ORAL | 3 refills | Status: DC

## 2022-07-11 NOTE — Progress Notes (Signed)
NEUROLOGY FOLLOW UP OFFICE NOTE  Brent CULLERS [id number] [birth year]  HISTORY OF PRESENT ILLNESS: I had the pleasure of seeing Brent Ramos in follow-up in the neurology clinic on 07/11/2022.  The patient was last seen 6 months ago for seizures and tremors. He is again accompanied by his POA and aide Brent Ramos who helps supplement the history today. Records and images were personally reviewed where available.  Brent Ramos had called our office last month requesting for an increase in olanzapine dose to '5mg'$  BID. He was stressed over the holidays with mold and water trouble in their home. Unbeknownst to Brent Ramos, he was taking it more than once a day, and has been doing better taking it BID. He continues on Levetiracetam '750mg'$  BID for seizure prophylaxis, no seizures or seizure-like symptoms since 12/2020. 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 He is fine when supine, sitting, but when he stands up, his legs are shaking. Once he starts walking, shaking stops. No falls since May 2023. No neck/back pain, focal numbness/tingling/weakness. He drinks only on weekends ("2 40s"). No side effects on medications.   History on Initial Assessment 05/30/2017: This is a pleasant 67 year old right-handed man with a history of alcohol abuse, seizures, presenting to establish care. He is accompanied by his POA and roommate Brent Ramos, who provides majority of the history. They report that seizures started in his 98s. He has no prior warning before the seizures. Brent Ramos has known him for 12 years and has witnessed several seizures. He would start staring off, answers "what" but does not follow commands, followed by left head deviation then 20 seconds later a generalized convulsion. Sometimes he has clusters of 4-5 seizures lasting 1-2 minutes at a time. He had been taking Dilantin since his  110s, up to 7-8 months ago when he started having balance issues and "jerkiness." Brent Ramos noted that when he stands, he would be rocking and shaking when standing still. He has jerking in sleep that wakes him up. He was switched to Keppra, and this seemed to help better. He was in the ER in May 2018 for seizures in the setting of alcohol abuse, alcohol level 52. Brent Ramos reports that the pharmacy did not deliver [facility] last September and he was off medication for a whole weekend then started having seizures. He was in the ER on 01/31/17 (per ER notes he was out of medication for 2 months due to finances). CT head showed mild atrophy, no acute changes. He was given a prescription for Keppra and discharged, then was back 3 days later after another seizure. He was then admitted on 02/05/17 after he had 6 witnessed seizures with rhabdomyolysis (CK >9000). His EEG at that time showed excess diffuse beta activity, no epileptiform discharges. He was discharged home, however Brent Ramos reports that once he got home, he was awake for 4-5 days, saying he would clean the room but pulled things out of drawers and left them askew. He was hallucinating and wandered off, Brent Ramos had to call Missing Persons 6 times, until finally the patient asked the police to take him to the ER on 02/20/17.  While in the ER, he was noted to have an episode of unresponsiveness. He was evaluated by [facility] and started on Zyprexa. Brent Ramos has known him for 12 years and has never seen any manic behavior until that time, but states "  he has always been mean and agitated," but has significantly improved on Zyprexa. He stopped drinking in August per Brent Ramos. He used to drink 2-3 18-pack of beer within 3 days. He is taking Keppra '500mg'$  BID without side effects, no seizures since September 2018.    He used to have a lot of headaches, but these seemed to ease up since starting the [place]. He occasionally gets dizzy upon standing. 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 He has occasional tingling in both hands and stinging on both feet. They got cold quickly. He has a lot of back pain. No bowel/bladder dysfunction. He denies any olfactory/gustatory hallucinations, deja vu, rising epigastric sensation, myoclonic jerks. Brent Ramos reports that the patient has "always been a little off" since he has known him. He needs things repeated for him. Brent Ramos has always been in charge of bills. He does not drive. Over the past month, the patient has been taking his own medications, Brent Ramos ensures he takes them.    Epilepsy Risk Factors:  He had a normal birth and early development.  There is no history of febrile convulsions, CNS infections such as meningitis/encephalitis, significant traumatic brain injury, neurosurgical procedures, or family history of seizures.  I personally reviewed head CT without contrast done 10/2019 which did not show any acute changes. 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  PAST MEDICAL HISTORY: Past Medical History:  Diagnosis Date   Alcohol abuse    Hypertension    Memory loss    Seizures ([place])    "has had them for years" (02/05/2017)   Tremors of nervous system    Vision abnormalities     MEDICATIONS: Current Outpatient Medications on File Prior to Visit  Medication Sig Dispense Refill   gabapentin (NEURONTIN) 100 MG capsule Take 1 capsule every night for 1 week, then increase to 1 capsule twice a day 60 capsule 11   levETIRAcetam (KEPPRA) 750 MG tablet TAKE 1 TABLET(750 MG) BY MOUTH TWICE DAILY 180 tablet 0   lisinopril (ZESTRIL) 40 MG tablet Take 40 mg by mouth daily.  0   Misc. Devices [facility]) MISC 1 each by Does not apply route daily in the afternoon. 1 each 0   OLANZapine (ZYPREXA) 5 MG tablet Take 1 tablet twice a day 60 tablet 6   No current facility-administered medications on file prior to visit.    ALLERGIES: No Known  Allergies  FAMILY HISTORY: Family History  Problem Relation Age of Onset   Dementia Mother    Kidney disease Sister    Heart Problems Sister     SOCIAL HISTORY: Social History   Socioeconomic History   Marital status: Divorced    Spouse name: Not on file   Number of children: Not on file   Years of education: Not on file   Highest education level: Not on file  Occupational History   Not on file  Tobacco Use   Smoking status: Every Day    Packs/day: 0.50    Years: 45.00    Total pack years: 22.50    Types: Cigarettes   Smokeless tobacco: Former    Types: Chew   Tobacco comments:    1 pack a day  Vaping Use   Vaping Use: Never used  Substance and Sexual Activity   Alcohol use: Yes    Comment: "had one last night"   Drug use: No   Sexual activity: Never  Other  Topics Concern   Not on file  Social History Narrative   Pt lives in 1 story home with his friend/caretaker, Brent Ramos   Has no children   Highest level of education: 11th grade   Unemployed - currently on SSI   Right handed   Drinks caffeine   Social Determinants of Health   Financial Resource Strain: Not on file  Food Insecurity: Not on file  Transportation Needs: Not on file  Physical Activity: Not on file  Stress: Not on file  Social Connections: Not on file  Intimate Partner Violence: Not on file     PHYSICAL EXAM: Vitals:   07/11/22 1348  BP: 129/82  Pulse: 80  SpO2: 96%   General: No acute distress Head:  Normocephalic/atraumatic Skin/Extremities: No rash, no edema Neurological Exam: alert and awake. No aphasia or dysarthria. Fund of knowledge is appropriate. Attention and concentration are normal.   Cranial nerves: Right phthisis bulbi, left pupil round. Extraocular movements intact on left eye with no nystagmus. Visual fields full on left eye.  No facial asymmetry.  Motor: +increased tone/slight cogwheeling bilaterally. Muscle strength 5/5 throughout with no pronator drift.   Finger to  nose testing intact.  Gait: he again starts having bobbing/bouncing leg tremors upon standing that resolve once he walks. Gait is slow and cautious, favors right ankle. No ataxia. No hand tremors. Good finger taps.   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 Head CT unremarkable. His 1-hour EEG was normal. He had been seizure-free for 4 years until he had a seizure on 12/30/20, dose of Levetiracetam increased to '750mg'$  BID with no further seizures. He had post-ictal psychosis and was treated with olanzapine, currently on '5mg'$  BID with good effect. They felt there was some initial improvement in leg tremors with gabapentin, increase to '200mg'$  in AM, '100mg'$  in PM. May increase further to '200mg'$  BID if needed. We again discussed minimizing alcohol intake, increasing hydration and regular exercise. He does not drive. Continue 24/7 care. Follow-up in 6 months, call for any changes.    Thank you for allowing me to participate in his care.  Please do not hesitate to call for any questions or concerns.    Ellouise Newer, M.D.   CC: Dr. Jonelle Sidle

## 2022-07-11 NOTE — Patient Instructions (Signed)
Good to see you.  Increase Gabapentin '100mg'$ : take 2 capsules in AM, 1 capsule in PM. See how you feel on this dose. If no change in leg tremors, can increase to 2 capsules in AM, 2 capsules in PM  2. Continue Levetiracetam (Keppra) '750mg'$  twice a day  3. Continue olanzapine '5mg'$  twice a day  4. Increase water intake (8 glasses of water a day at least)  5. Increase physical exercise  6. Follow-up in 6 months, call for any changes   Seizure Precautions: 1. If medication has been prescribed for you to prevent seizures, take it exactly as directed.  Do not stop taking the medicine without talking to your doctor first, even if you have not had a seizure in a long time.   2. Avoid activities in which a seizure would cause danger to yourself or to others.  Don't operate dangerous machinery, swim alone, or climb in high or dangerous places, such as on ladders, roofs, or girders.  Do not drive unless your doctor says you may.  3. If you have any warning that you may have a seizure, lay down in a safe place where you can't hurt yourself.    4.  No driving for 6 months from last seizure, as per [facility].   Please refer to the following link on the [facility] website for more information: http://www.epilepsyfoundation.org/answerplace/Social/driving/drivingu.cfm   5.  Maintain good sleep hygiene.Avoid alcohol.  6.  Contact your doctor if you have any problems that may be related to the medicine you are taking.  7.  Call 911 and bring the patient back to the ED if:        A.  The seizure lasts longer than 5 minutes.       B.  The patient doesn't awaken shortly after the seizure  C.  The patient has new problems such as difficulty seeing, speaking or moving  D.  The patient was injured during the seizure  E.  The patient has a temperature over 102 F (39C)  F.  The patient vomited and now is having trouble breathing

## 2023-01-16 ENCOUNTER — Other Ambulatory Visit: Payer: Self-pay | Admitting: Neurology

## 2023-01-19 ENCOUNTER — Encounter: Payer: Self-pay | Admitting: Neurology

## 2023-01-19 ENCOUNTER — Ambulatory Visit (INDEPENDENT_AMBULATORY_CARE_PROVIDER_SITE_OTHER): Payer: Medicare HMO | Admitting: Neurology

## 2023-01-19 VITALS — BP 117/72 | HR 102 | Ht 75.0 in | Wt 267.4 lb

## 2023-01-19 DIAGNOSIS — R251 Tremor, unspecified: Secondary | ICD-10-CM

## 2023-01-19 DIAGNOSIS — R400 Somnolence: Secondary | ICD-10-CM

## 2023-01-19 DIAGNOSIS — G40009 Localization-related (focal) (partial) idiopathic epilepsy and epileptic syndromes with seizures of localized onset, not intractable, without status epilepticus: Secondary | ICD-10-CM | POA: Diagnosis not present

## 2023-01-19 MED ORDER — GABAPENTIN 100 MG PO CAPS: ORAL_CAPSULE | ORAL | 3 refills | Status: DC

## 2023-01-19 MED ORDER — LEVETIRACETAM 750 MG PO TABS: ORAL_TABLET | ORAL | 3 refills | Status: DC

## 2023-01-19 MED ORDER — OLANZAPINE 5 MG PO TABS: ORAL_TABLET | ORAL | 3 refills | Status: DC

## 2023-01-19 NOTE — Patient Instructions (Signed)
Good to see you doing better.   Schedule home sleep study  2. Reduce Olanzapine 5mg : take 1 tablet every night  3. Continue Levetiracetam 750mg  twice a day  4. Continue Gabapentin 100mg : Take 2 caps in AM, 1 cap in PM  5. Follow-up in 6 months, call for any changes   Seizure Precautions: 1. If medication has been prescribed for you to prevent seizures, take it exactly as directed.  Do not stop taking the medicine without talking to your doctor first, even if you have not had a seizure in a long time.   2. Avoid activities in which a seizure would cause danger to yourself or to others.  Don't operate dangerous machinery, swim alone, or climb in high or dangerous places, such as on ladders, roofs, or girders.  Do not drive unless your doctor says you may.  3. If you have any warning that you may have a seizure, lay down in a safe place where you can't hurt yourself.    4.  No driving for 6 months from last seizure, as per [facility].   Please refer to the following link on the Epilepsy Foundation of America's website for more information: http://www.epilepsyfoundation.org/answerplace/Social/driving/drivingu.cfm   5.  Maintain good sleep hygiene. Avoid alcohol.  6.  Contact your doctor if you have any problems that may be related to the medicine you are taking.  7.  Call 911 and bring the patient back to the ED if:        A.  The seizure lasts longer than 5 minutes.       B.  The patient doesn't awaken shortly after the seizure  C.  The patient has new problems such as difficulty seeing, speaking or moving  D.  The patient was injured during the seizure  E.  The patient has a temperature over 102 F (39C)  F.  The patient vomited and now is having trouble breathing

## 2023-01-19 NOTE — Progress Notes (Signed)
NEUROLOGY FOLLOW UP OFFICE NOTE  Brent Ramos [id number] [birth year]  HISTORY OF PRESENT ILLNESS: I had the pleasure of seeing Brent Ramos in follow-up in the neurology clinic on 01/19/2023.  The patient was last seen 6 months ago for seizures and tremors. He is again accompanied by his POA and aide [name] who helps supplement the history today.  Records and images were personally reviewed where available. On his last visit, they reported some reduction in leg tremors with low dose Gabapentin, dose increased to 200mg  in AM, 100mg  in PM. They note that his legs are not trembling as much. No side effects. He has not had any seizures since 12/2020 on Levetiracetam 750mg  BID. He was having more mood changes and increased olanzapine to 5mg  BID in January 2024. He states mood is good. He takes his own medications, [name] checks behind him. He appears tired today, they both report that he does not rest well at night. [name] hears him snoring. He would nap 1-2 hours during the daytime. [name] also notes he has been gaining weight, he does not exercise.    History on Initial Assessment 05/30/2017: This is a pleasant 67 year old right-handed man with a history of alcohol abuse, seizures, presenting to establish care. He is accompanied by his POA and roommate [name], who provides majority of the history. They report that seizures started in his 84s. He has no prior warning before the seizures. [name] has known him for 12 years and has witnessed several seizures. He would start staring off, answers "what" but does not follow commands, followed by left head deviation then 20 seconds later a generalized convulsion. Sometimes he has clusters of 4-5 seizures lasting 1-2 minutes at a time. He had been taking Dilantin since his 69s, up to 7-8 months ago when he started having balance issues and "jerkiness." [name] noted that when he stands, he would be rocking and shaking when standing still. He has jerking in sleep that  wakes him up. He was switched to Keppra, and this seemed to help better. He was in the ER in May 2018 for seizures in the setting of alcohol abuse, alcohol level 52. [name] reports that the pharmacy did not deliver Keppra last September and he was off medication for a whole weekend then started having seizures. He was in the ER on 01/31/17 (per ER notes he was out of medication for 2 months due to finances). CT head showed mild atrophy, no acute changes. He was given a prescription for Keppra and discharged, then was back 3 days later after another seizure. He was then admitted on 02/05/17 after he had 6 witnessed seizures with rhabdomyolysis (CK >9000). His EEG at that time showed excess diffuse beta activity, no epileptiform discharges. He was discharged home, however [name] reports that once he got home, he was awake for 4-5 days, saying he would clean the room but pulled things out of drawers and left them askew. He was hallucinating and wandered off, [name] had to call Missing Persons 6 times, until finally the patient asked the police to take him to the ER on 02/20/17.  While in the ER, he was noted to have an episode of unresponsiveness. He was evaluated by [facility] and started on Zyprexa. [name] has known him for 12 years and has never seen any manic behavior until that time, but states "he has always been mean and agitated," but has significantly improved on Zyprexa. He stopped drinking in August per [name]. He used  to drink 2-3 18-pack of beer within 3 days. He is taking Keppra 500mg  BID without side effects, no seizures since September 2018.    He used to have a lot of headaches, but these seemed to ease up since starting the Keppra. He occasionally gets dizzy upon standing. 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 He has occasional tingling in both hands and stinging on both feet. They got cold quickly. He has a lot of back pain. No bowel/bladder dysfunction. He denies any  olfactory/gustatory hallucinations, deja vu, rising epigastric sensation, myoclonic jerks. [name] reports that the patient has "always been a little off" since he has known him. He needs things repeated for him. [name] has always been in charge of bills. He does not drive. Over the past month, the patient has been taking his own medications, [name] ensures he takes them.    Epilepsy Risk Factors:  He had a normal birth and early development.  There is no history of febrile convulsions, CNS infections such as meningitis/encephalitis, significant traumatic brain injury, neurosurgical procedures, or family history of seizures.  I personally reviewed head CT without contrast done 10/2019 which did not show any acute changes. 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  PAST MEDICAL HISTORY: Past Medical History:  Diagnosis Date   Alcohol abuse    Hypertension    Memory loss    Seizures ([facility])    "has had them for years" (02/05/2017)   Tremors of nervous system    Vision abnormalities     MEDICATIONS: Current Outpatient Medications on File Prior to Visit  Medication Sig Dispense Refill   gabapentin (NEURONTIN) 100 MG capsule Take 2 capsules twice a day (Patient taking differently: Take 2 capsules in the am and I capsule at night) 360 capsule 3   levETIRAcetam (KEPPRA) 750 MG tablet Take 1 tablet twice a day 180 tablet 3   lisinopril (ZESTRIL) 40 MG tablet Take 40 mg by mouth daily.  0   Misc. Devices [facility]) MISC 1 each by Does not apply route daily in the afternoon. 1 each 0   OLANZapine (ZYPREXA) 5 MG tablet TAKE 1 TABLET BY MOUTH TWICE DAILY 60 tablet 1   No current facility-administered medications on file prior to visit.    ALLERGIES: No Known Allergies  FAMILY HISTORY: Family History  Problem Relation Age of Onset   Dementia Mother    Kidney disease Sister    Heart Problems Sister     SOCIAL HISTORY: Social History    Socioeconomic History   Marital status: Divorced    Spouse name: Not on file   Number of children: Not on file   Years of education: Not on file   Highest education level: Not on file  Occupational History   Not on file  Tobacco Use   Smoking status: Every Day    Current packs/day: 0.50    Average packs/day: 0.5 packs/day for 45.0 years (22.5 ttl pk-yrs)    Types: Cigarettes   Smokeless tobacco: Current    Types: Chew   Tobacco comments:    1 pack a day  Vaping Use   Vaping status: Never Used  Substance and Sexual Activity   Alcohol use: Yes    Comment: "had one last night"   Drug use: No   Sexual activity: Never  Other Topics Concern   Not on file  Social History Narrative   Pt lives in 1  story home with his friend/caretaker, [name]   Has no children   Highest level of education: 11th grade   Unemployed - currently on SSI   Right handed   Drinks caffeine   Service dog    Social Determinants of Health   Financial Resource Strain: Not on file  Food Insecurity: Not on file  Transportation Needs: Not on file  Physical Activity: Not on file  Stress: Not on file  Social Connections: Not on file  Intimate Partner Violence: Not on file     PHYSICAL EXAM: Vitals:   01/19/23 1135  BP: 117/72  Pulse: (!) 102  SpO2: 95%   General: No acute distress Head:  Normocephalic/atraumatic Skin/Extremities: No rash, no edema Neurological Exam: alert and awake. No aphasia or dysarthria. Fund of knowledge is appropriate.  Attention and concentration are normal.   Cranial nerves: Right phthisis bulbi, left pupil round. Extraocular movements intact on left eye with no nystagmus. Visual fields full on left eye.  No facial asymmetry.  Motor: again note of increased tone and cogwheeling L>R. Muscle strength 5/5 throughout with no pronator drift.   Finger to nose testing intact.  Gait: there is again note of bobbing/bouncing leg tremors upon standing that resolve with walking. Gait is  slow and cautious. No hand tremors.    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 Head CT unremarkable. His 1-hour EEG was normal. He has been seizure-free since 12/2020 on Levetiracetam 750mg  BID. He had post-ictal psychosis and was started on olanzapine, dose increased last January due to more mood changes. We discussed bradykinesia, parkinsonism on exam, would reduce Olanzapine back to 5mg  at bedtime. The Gabapentin has helped with leg tremors, continue 200mg  in AM, 100mg  in PM. They endorse sleep difficulties, daytime drowsiness and snoring, home sleep study will be ordered to assess for OSA. He does not drive. He was encouraged to increase physical exercise. They ask about [place]gov[place], advised to speak to PCP if he is a candidate, from a seizure standpoint, no contraindications. Follow-up in 6 months, call for any changes.     Thank you for allowing me to participate in his care.  Please do not hesitate to call for any questions or concerns.    Patrcia Dolly, M.D.   CC: Dr. Mikeal Hawthorne

## 2023-01-26 ENCOUNTER — Ambulatory Visit: Payer: 59 | Admitting: Neurology

## 2023-03-02 ENCOUNTER — Telehealth: Payer: Self-pay

## 2023-03-02 NOTE — Telephone Encounter (Signed)
Home sleep test PA # [phone]  Sleep Center called an given the PA number

## 2023-03-23 ENCOUNTER — Ambulatory Visit (HOSPITAL_BASED_OUTPATIENT_CLINIC_OR_DEPARTMENT_OTHER): Payer: Medicare HMO | Attending: Neurology | Admitting: Internal Medicine

## 2023-03-23 DIAGNOSIS — G4733 Obstructive sleep apnea (adult) (pediatric): Secondary | ICD-10-CM | POA: Insufficient documentation

## 2023-03-23 DIAGNOSIS — R400 Somnolence: Secondary | ICD-10-CM | POA: Diagnosis present

## 2023-04-07 DIAGNOSIS — G4733 Obstructive sleep apnea (adult) (pediatric): Secondary | ICD-10-CM

## 2023-04-07 DIAGNOSIS — R400 Somnolence: Secondary | ICD-10-CM

## 2023-04-07 NOTE — Procedures (Signed)
     Patient Name: Brent Ramos, Brent Ramos Date: 04/01/2023 Gender: Male D.O.B: 08/14/1955 Age (years): 67 Referring Provider: Van Clines Height (inches): 75 Interpreting Physician: Jetty Duhamel MD, ABSM Weight (lbs): 267 RPSGT: Edison Sink BMI: 33 MRN: [id number] Neck Size: 17.00  CLINICAL INFORMATION Sleep Study Type: HST Indication for sleep study: Insomnia with sleep apnea Epworth Sleepiness Score: 11  SLEEP STUDY TECHNIQUE A multi-channel overnight portable sleep study was performed. The channels recorded were: nasal airflow, thoracic respiratory movement, and oxygen saturation with a pulse oximetry. Snoring was also monitored.  MEDICATIONS Patient self administered medications include: none reported.  SLEEP ARCHITECTURE Patient was studied for 440 minutes. The sleep efficiency was 100.0 % and the patient was supine for 0%. The arousal index was 0.0 per hour.  RESPIRATORY PARAMETERS The overall AHI was 9.5 per hour, with a central apnea index of 0 per hour. The oxygen nadir was 87% during sleep.  CARDIAC DATA Mean heart rate during sleep was 72.5 bpm.  IMPRESSIONS - Mild obstructive sleep apnea occurred during this study (AHI = 9.5/h). - Mild oxygen desaturation was noted during this study (Min O2 = 87%, Mean 91%). - Patient snored.  DIAGNOSIS - Obstructive Sleep Apnea (G47.33)  RECOMMENDATIONS - Conservative management, CPAP, or a fitted oral appliance might be options, based on clinical judgment. - Consider if insomnia is also a treatment opportunity. - Sleep hygiene should be reviewed to assess factors that may improve sleep quality. - Weight management and regular exercise should be initiated or continued.  [Electronically signed] 04/07/2023 11:57 AM  Jetty Duhamel MD, ABSM Diplomate, American Board of Sleep Medicine NPI: [id number]                        Jetty Duhamel Diplomate, American Board of Sleep  Medicine  ELECTRONICALLY SIGNED ON:  04/07/2023, 11:53 AM [facility] PH: [phone]   FX: [phone] ACCREDITED BY THE AMERICAN ACADEMY OF SLEEP MEDICINE

## 2023-04-09 ENCOUNTER — Telehealth: Payer: Self-pay

## 2023-04-09 DIAGNOSIS — G4733 Obstructive sleep apnea (adult) (pediatric): Secondary | ICD-10-CM

## 2023-04-09 NOTE — Telephone Encounter (Signed)
Pt's caregiver [name] called in returning Heather's call

## 2023-04-09 NOTE — Telephone Encounter (Signed)
Pt called no answer left a voice mail to call the office back  °

## 2023-04-09 NOTE — Telephone Encounter (Signed)
-----   Message from Van Clines sent at 04/08/2023  9:44 PM EST ----- Regarding: sleep study results Please let patient/caregiver know the sleep study showed mild sleep apnea. I would like him to see the sleep specialist to discuss options for treatment. Pls refer to Pulmonary for mild OSA, thanks ----- Message ----- From: Waymon Budge, MD Sent: 04/07/2023  11:59 AM EST To: Rometta Emery, MD; Van Clines, MD

## 2023-04-09 NOTE — Telephone Encounter (Signed)
Pt caregiver informed the sleep study showed mild sleep apnea. Dr Benny Lennert would like him to see the sleep specialist to discuss options for treatment. Referral placed for Pulmonary for mild OSA

## 2023-07-23 ENCOUNTER — Other Ambulatory Visit: Payer: Self-pay | Admitting: Neurology

## 2023-08-13 ENCOUNTER — Encounter: Payer: Self-pay | Admitting: Neurology

## 2023-08-13 ENCOUNTER — Telehealth (INDEPENDENT_AMBULATORY_CARE_PROVIDER_SITE_OTHER): Payer: Medicare HMO | Admitting: Neurology

## 2023-08-13 VITALS — Ht 74.0 in | Wt 240.0 lb

## 2023-08-13 DIAGNOSIS — R251 Tremor, unspecified: Secondary | ICD-10-CM | POA: Diagnosis not present

## 2023-08-13 DIAGNOSIS — G40009 Localization-related (focal) (partial) idiopathic epilepsy and epileptic syndromes with seizures of localized onset, not intractable, without status epilepticus: Secondary | ICD-10-CM

## 2023-08-13 MED ORDER — OLANZAPINE 5 MG PO TABS: ORAL_TABLET | ORAL | 11 refills | Status: DC

## 2023-08-13 MED ORDER — GABAPENTIN 100 MG PO CAPS: ORAL_CAPSULE | ORAL | 3 refills | Status: DC

## 2023-08-13 MED ORDER — LEVETIRACETAM 750 MG PO TABS: ORAL_TABLET | ORAL | 3 refills | Status: DC

## 2023-08-13 NOTE — Progress Notes (Signed)
 Virtual Visit via Video Note The purpose of this virtual visit is to provide medical care while limiting exposure to the novel coronavirus.    Consent was obtained for video visit:  Yes.   Answered questions that patient had about telehealth interaction:  Yes.     Pt location: Home Physician Location: office Name of referring provider:  Rometta Emery, MD I connected with Brent Ramos at patients initiation/request on 08/13/2023 at 10:00 AM EDT by video enabled telemedicine application and verified that I am speaking with the correct person using two identifiers. Pt MRN:  [id number] Pt DOB:  [birth year] Video Participants:  Brent Ramos;  Sande Rives (POA)   History of Present Illness:  The patient had a virtual video visit on 08/13/2023. He was last seen in the office 6 months ago for seizures and tremors. His aide and POA [name] is present to provide additional information. They deny any seizures since 12/2020 on Levetiracetam 750mg  BID. He is treated with Gabapentin 200mg  in AM, 100mg  in PM for orthostatic tremors, the tremors are not as bad, still there when standing still but not as intense. No falls. 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 [name] reports that he had an anger outburst one evening and they increased olanzapine back to 5mg  BID with no further similar issues. [name] reports his mental things can just be triggered and it is hard to say when they come. He denies any headaches, dizziness, neck/back pain, focal numbness/tingling/weakness. They were reporting daytime drowsiness, home sleep study done 03/2023 showed mild OSA. [name] reports that his sleep has gotten better, he took Advil PM one time, and has been sleeping good since then with no need for another sleep aide. He is not as sleepy during the day. He does not exercise much, [name] tries but has to be careful about his mood is  pushed to exercise.   History on Initial Assessment 05/30/2017: This is a pleasant 68 year old right-handed man with a history of alcohol abuse, seizures, presenting to establish care. He is accompanied by his POA and roommate [name], who provides majority of the history. They report that seizures started in his 67s. He has no prior warning before the seizures. [name] has known him for 12 years and has witnessed several seizures. He would start staring off, answers "what" but does not follow commands, followed by left head deviation then 20 seconds later a generalized convulsion. Sometimes he has clusters of 4-5 seizures lasting 1-2 minutes at a time. He had been taking Dilantin since his 2s, up to 7-8 months ago when he started having balance issues and "jerkiness." [name] noted that when he stands, he would be rocking and shaking when standing still. He has jerking in sleep that wakes him up. He was switched to Keppra, and this seemed to help better. He was in the ER in May 2018 for seizures in the setting of alcohol abuse, alcohol level 52. [name] reports that the pharmacy did not deliver Keppra last September and he was off medication for a whole weekend then started having seizures. He was in the ER on 01/31/17 (per ER notes he was out of medication for 2 months due to finances). CT head showed mild atrophy, no acute changes. He was given a prescription for Keppra and discharged, then was back 3 days later after another seizure. He was then admitted on 02/05/17 after he had 6 witnessed  seizures with rhabdomyolysis (CK >9000). His EEG at that time showed excess diffuse beta activity, no epileptiform discharges. He was discharged home, however [name] reports that once he got home, he was awake for 4-5 days, saying he would clean the room but pulled things out of drawers and left them askew. He was hallucinating and wandered off, [name] had to call Missing Persons 6 times, until finally the patient asked the  police to take him to the ER on 02/20/17.  While in the ER, he was noted to have an episode of unresponsiveness. He was evaluated by [facility] and started on Zyprexa. [name] has known him for 12 years and has never seen any manic behavior until that time, but states "he has always been mean and agitated," but has significantly improved on Zyprexa. He stopped drinking in August per [name]. He used to drink 2-3 18-pack of beer within 3 days. He is taking Keppra 500mg  BID without side effects, no seizures since September 2018.    He used to have a lot of headaches, but these seemed to ease up since starting the Keppra. He occasionally gets dizzy upon standing. 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 He has occasional tingling in both hands and stinging on both feet. They got cold quickly. He has a lot of back pain. No bowel/bladder dysfunction. He denies any olfactory/gustatory hallucinations, deja vu, rising epigastric sensation, myoclonic jerks. [name] reports that the patient has "always been a little off" since he has known him. He needs things repeated for him. [name] has always been in charge of bills. He does not drive. Over the past month, the patient has been taking his own medications, [name] ensures he takes them.    Epilepsy Risk Factors:  He had a normal birth and early development.  There is no history of febrile convulsions, CNS infections such as meningitis/encephalitis, significant traumatic brain injury, neurosurgical procedures, or family history of seizures.  I personally reviewed head CT without contrast done 10/2019 which did not show any acute changes. 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    Current Outpatient Medications on File Prior to Visit  Medication Sig Dispense Refill   gabapentin (NEURONTIN) 100 MG capsule Take 2 capsules in AM, 1 capsule in PM 270 capsule 3   levETIRAcetam (KEPPRA)  750 MG tablet Take 1 tablet twice a day 180 tablet 3   lisinopril (ZESTRIL) 40 MG tablet Take 40 mg by mouth daily.  0   Misc. Devices [facility]) MISC 1 each by Does not apply route daily in the afternoon. 1 each 0   OLANZapine (ZYPREXA) 5 MG tablet TAKE 1 TABLET BY MOUTH EVERY OTHER DAY (Patient taking differently: Take 5 mg by mouth in the morning and at bedtime.) 45 tablet 3   No current facility-administered medications on file prior to visit.     Observations/Objective:   Vitals:   08/13/23 0932  Weight: 240 lb (108.9 kg)  Height: 6\' 2"  (1.88 m)   GEN:  The patient appears stated age and is in NAD.  Neurological examination: Patient is awake, alert. No aphasia or dysarthria. Intact fluency and comprehension. Cranial nerves: Right phthisis bulbi. No facial asymmetry. Motor: moves all extremities symmetrically, at least anti-gravity x 4.    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 Head CT unremarkable. His 1-hour EEG was normal. He has been seizure-free since 12/2020 on Levetiracetam 750mg  BID. He had post-ictal psychosis and was started on olanzapine. Dose reduced on last visit due to parkinsonism on exam, however anger outbursts recurred so he is back on 5mg  BID. We discussed monitoring parkinsonian signs back on higher dose. Gabapentin has helped with leg tremors, continue 200mg  in AM, 100mg  in PM. Continue close supervision. He was encouraged to increase physical activity. He does not drive. Follow-up in 6 months, call for any changes.   Follow Up Instructions:    -I discussed the assessment and treatment plan with the patient. The patient was provided an opportunity to ask questions and all were answered. The patient agreed with the plan and demonstrated an understanding of the instructions.   The patient was advised to  call back or seek an in-person evaluation if the symptoms worsen or if the condition fails to improve as anticipated.     Van Clines, MD

## 2023-08-13 NOTE — Patient Instructions (Signed)
 Always good to see you. Continue all your medications. Monitor for any signs of parkinsonism back on higher dose Olanzapine. Follow-up in 6 months, call for any changes.    Seizure Precautions: 1. If medication has been prescribed for you to prevent seizures, take it exactly as directed.  Do not stop taking the medicine without talking to your doctor first, even if you have not had a seizure in a long time.   2. Avoid activities in which a seizure would cause danger to yourself or to others.  Don't operate dangerous machinery, swim alone, or climb in high or dangerous places, such as on ladders, roofs, or girders.  Do not drive unless your doctor says you may.  3. If you have any warning that you may have a seizure, lay down in a safe place where you can't hurt yourself.    4.  No driving for 6 months from last seizure, as per [facility].   Please refer to the following link on the Epilepsy Foundation of America's website for more information: http://www.epilepsyfoundation.org/answerplace/Social/driving/drivingu.cfm   5.  Maintain good sleep hygiene. Avoid alcohol.  6.  Contact your doctor if you have any problems that may be related to the medicine you are taking.  7.  Call 911 and bring the patient back to the ED if:        A.  The seizure lasts longer than 5 minutes.       B.  The patient doesn't awaken shortly after the seizure  C.  The patient has new problems such as difficulty seeing, speaking or moving  D.  The patient was injured during the seizure  E.  The patient has a temperature over 102 F (39C)  F.  The patient vomited and now is having trouble breathing

## 2024-01-17 ENCOUNTER — Telehealth: Payer: Self-pay | Admitting: Neurology

## 2024-01-17 NOTE — Telephone Encounter (Signed)
**Note De-identified  [place] Obfuscation** Please advise 

## 2024-01-17 NOTE — Telephone Encounter (Signed)
 Pt.s Caretaker calling as Pt is still have leg shakes and the Rx gabapentin  (NEURONTIN ) 100 MG capsule is not helping. Pt.s caretaker has stopped him from taking Rx and would like to discuss other Rx options

## 2024-01-18 NOTE — Telephone Encounter (Signed)
 Pls let his caregiver know that the Gabapentin  he is on is a low dose, instead of stopping and switching, I would try increasing the dose and see if this helps him better. He was on 2 caps in AM, 1 cap in PM. Would go to 2 caps twice a day. Thanks

## 2024-01-18 NOTE — Telephone Encounter (Signed)
 Called [name] (caregiver) LVM--to call the office back.

## 2024-01-21 NOTE — Telephone Encounter (Signed)
 Spoke to Care giver Beverlie) regarding Gabapentin  would go to 2 caps twice a day per Dr Georjean.

## 2024-01-26 ENCOUNTER — Other Ambulatory Visit: Payer: Self-pay | Admitting: Neurology

## 2024-01-28 ENCOUNTER — Telehealth: Payer: Self-pay | Admitting: Neurology

## 2024-01-28 MED ORDER — OLANZAPINE 5 MG PO TABS: ORAL_TABLET | ORAL | 3 refills | Status: DC

## 2024-01-28 NOTE — Telephone Encounter (Signed)
 [name] called in this morning for pt. and he stated that pt needs refill on his prescription call: OLANZapine  (ZYPREXA ) 5 MG tablet . He has taken last pill today. The order needs to be change how offer the pills are to be taken, due to pt was told to take pills twice a day, but the pharmacy needs to change their instruction to say twice a day. Thanks

## 2024-01-28 NOTE — Telephone Encounter (Signed)
 Last OV 08/03/23 Please advise

## 2024-01-28 NOTE — Telephone Encounter (Signed)
 Please verified if pt taking Olanzapine  5 mg 1 tab or 2 tab a day.

## 2024-01-28 NOTE — Telephone Encounter (Signed)
 Pls let Lamar know I sent in the Zyprexa  prescription for 1 tab BID to the pharmacy. Thanks

## 2024-01-29 NOTE — Telephone Encounter (Signed)
 LVM--regarding medication dent to the pharmacy.

## 2024-03-11 ENCOUNTER — Encounter: Payer: Self-pay | Admitting: Neurology

## 2024-03-11 ENCOUNTER — Ambulatory Visit: Admitting: Neurology

## 2024-03-11 VITALS — BP 135/75 | HR 86 | Ht 74.0 in | Wt 269.6 lb

## 2024-03-11 DIAGNOSIS — G40009 Localization-related (focal) (partial) idiopathic epilepsy and epileptic syndromes with seizures of localized onset, not intractable, without status epilepticus: Secondary | ICD-10-CM | POA: Diagnosis not present

## 2024-03-11 DIAGNOSIS — R251 Tremor, unspecified: Secondary | ICD-10-CM

## 2024-03-11 MED ORDER — LEVETIRACETAM 750 MG PO TABS: ORAL_TABLET | ORAL | 3 refills | Status: AC

## 2024-03-11 MED ORDER — OLANZAPINE 5 MG PO TABS: ORAL_TABLET | ORAL | 3 refills | Status: AC

## 2024-03-11 MED ORDER — GABAPENTIN 100 MG PO CAPS: ORAL_CAPSULE | ORAL | 3 refills | Status: AC

## 2024-03-11 NOTE — Patient Instructions (Signed)
 Good to see you.  Continue Gabapentin  100mg : take 2 capsules twice a day, Levetiracetam  750mg  twice a day, and Olanzapine  5mg  twice a day  2. Continue working on increasing hydration  3. Discuss smoking cessation strategies with Dr. Sim  4. Follow-up in 4 months, call for any changes   Seizure Precautions: 1. If medication has been prescribed for you to prevent seizures, take it exactly as directed.  Do not stop taking the medicine without talking to your doctor first, even if you have not had a seizure in a long time.   2. Avoid activities in which a seizure would cause danger to yourself or to others.  Don't operate dangerous machinery, swim alone, or climb in high or dangerous places, such as on ladders, roofs, or girders.  Do not drive unless your doctor says you may.  3. If you have any warning that you may have a seizure, lay down in a safe place where you can't hurt yourself.    4.  No driving for 6 months from last seizure, as per [place]  state law.   Please refer to the following link on the Epilepsy Foundation of America's website for more information: http://www.epilepsyfoundation.org/answerplace/Social/driving/drivingu.cfm   5.  Maintain good sleep hygiene. Avoid alcohol.  6.  Contact your doctor if you have any problems that may be related to the medicine you are taking.  7.  Call 911 and bring the patient back to the ED if:        A.  The seizure lasts longer than 5 minutes.       B.  The patient doesn't awaken shortly after the seizure  C.  The patient has new problems such as difficulty seeing, speaking or moving  D.  The patient was injured during the seizure  E.  The patient has a temperature over 102 F (39C)  F.  The patient vomited and now is having trouble breathing

## 2024-03-11 NOTE — Progress Notes (Signed)
 NEUROLOGY FOLLOW UP OFFICE NOTE  Kameran L Morillo [id number] 1956/05/13  HISTORY OF PRESENT ILLNESS: I had the pleasure of seeing Aiden Bukhari in follow-up in the neurology clinic on 03/11/2024.  The patient was last seen 7 months ago for seizures and tremors. He is again accompanied by his aide and POA Lamar and another caregiver Cassandra who help supplement the history today.  Records and images were personally reviewed where available.  Since his last visit, Lamar contacted our office on 01/18/24 to report that he is still having the leg shakes and Gabapentin  was not helping, advised to increase Gabapentin  to 200mg  BID for orthostatic tremors. No seizures since 12/2020 on Levetiracetam  750mg  BID. He is on olanzapine  5mg  BID (anger outbursts on lower dose).   Lamar has not noticed any improvement in leg tremors upon standing with increase in Gabapentin , no side effects. No resting tremor, the leg tremors only occur when he stands, and improve gradually as he starts moving around. No falls. He denies any back pain, no focal numbness/tingling/weakness, bowel/bladder dysfunction. He takes really small steps. Lamar notes improvement during the day, he is not sleeping in as much and walking a little more. He only drinks water  to swallow his medications, otherwise he mostly drinks Lipton tea. He drinks 2-3 beers every other weekend. He smokes 1/2 to 3/4 ppd, smoking more when drinking beer. Lamar has noticed he breathes more audibly. He sleeps pretty good at night. Mood is stable.     History on Initial Assessment 05/30/2017: This is a pleasant 68 year old right-handed man with a history of alcohol abuse, seizures, presenting to establish care. He is accompanied by his POA and roommate Lamar, who provides majority of the history. They report that seizures started in his 60s. He has no prior warning before the seizures. Lamar has known him for 12 years and has witnessed several seizures. He would start  staring off, answers what but does not follow commands, followed by left head deviation then 20 seconds later a generalized convulsion. Sometimes he has clusters of 4-5 seizures lasting 1-2 minutes at a time. He had been taking Dilantin  since his 67s, up to 7-8 months ago when he started having balance issues and jerkiness. Lamar noted that when he stands, he would be rocking and shaking when standing still. He has jerking in sleep that wakes him up. He was switched to Keppra , and this seemed to help better. He was in the ER in May 2018 for seizures in the setting of alcohol abuse, alcohol level 52. Lamar reports that the pharmacy did not deliver Keppra  last September and he was off medication for a whole weekend then started having seizures. He was in the ER on 01/31/17 (per ER notes he was out of medication for 2 months due to finances). CT head showed mild atrophy, no acute changes. He was given a prescription for Keppra  and discharged, then was back 3 days later after another seizure. He was then admitted on 02/05/17 after he had 6 witnessed seizures with rhabdomyolysis (CK >9000). His EEG at that time showed excess diffuse beta activity, no epileptiform discharges. He was discharged home, however Lamar reports that once he got home, he was awake for 4-5 days, saying he would clean the room but pulled things out of drawers and left them askew. He was hallucinating and wandered off, Lamar had to call Missing Persons 6 times, until finally the patient asked the police to take him to the ER on 02/20/17.  While in the ER, he was noted to have an episode of unresponsiveness. He was evaluated by [facility] and started on Zyprexa . Lamar has known him for 12 years and has never seen any manic behavior until that time, but states he has always been mean and agitated, but has significantly improved on Zyprexa . He stopped drinking in August per Lamar. He used to drink 2-3 18-pack of beer within 3 days. He  is taking Keppra  500mg  BID without side effects, no seizures since September 2018.    He used to have a lot of headaches, but these seemed to ease up since starting the Keppra . He occasionally gets dizzy upon standing. 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 He has occasional tingling in both hands and stinging on both feet. They got cold quickly. He has a lot of back pain. No bowel/bladder dysfunction. He denies any olfactory/gustatory hallucinations, deja vu, rising epigastric sensation, myoclonic jerks. Lamar reports that the patient has always been a little off since he has known him. He needs things repeated for him. Lamar has always been in charge of bills. He does not drive. Over the past month, the patient has been taking his own medications, Lamar ensures he takes them.    Epilepsy Risk Factors:  He had a normal birth and early development.  There is no history of febrile convulsions, CNS infections such as meningitis/encephalitis, significant traumatic brain injury, neurosurgical procedures, or family history of seizures.  I personally reviewed head CT without contrast done 10/2019 which did not show any acute changes. 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  PAST MEDICAL HISTORY: Past Medical History:  Diagnosis Date   Alcohol abuse    Hypertension    Memory loss    Seizures (HCC)    has had them for years (02/05/2017)   Tremors of nervous system    Vision abnormalities     MEDICATIONS: Current Outpatient Medications on File Prior to Visit  Medication Sig Dispense Refill   gabapentin  (NEURONTIN ) 100 MG capsule Take 2 capsules in AM, 1 capsule in PM 270 capsule 3   levETIRAcetam  (KEPPRA ) 750 MG tablet Take 1 tablet twice a day 180 tablet 3   lisinopril (ZESTRIL) 40 MG tablet Take 40 mg by mouth daily.  0   Misc. Devices [facility]) MISC 1 each by Does not apply route daily in the afternoon. 1  each 0   OLANZapine  (ZYPREXA ) 5 MG tablet Take 1 tablet twice a day 180 tablet 3   No current facility-administered medications on file prior to visit.    ALLERGIES: No Known Allergies  FAMILY HISTORY: Family History  Problem Relation Age of Onset   Dementia Mother    Kidney disease Sister    Heart Problems Sister     SOCIAL HISTORY: Social History   Socioeconomic History   Marital status: Divorced    Spouse name: Not on file   Number of children: Not on file   Years of education: Not on file   Highest education level: Not on file  Occupational History   Not on file  Tobacco Use   Smoking status: Every Day    Current packs/day: 0.50    Average packs/day: 0.5 packs/day for 45.0 years (22.5 ttl pk-yrs)    Types: Cigarettes   Smokeless tobacco: Current    Types: Chew   Tobacco comments:    1 pack a day  Vaping Use  Vaping status: Never Used  Substance and Sexual Activity   Alcohol use: Yes    Comment: had one last night   Drug use: No   Sexual activity: Never  Other Topics Concern   Not on file  Social History Narrative   Pt lives in 1 story home with his friend/caretaker, Lamar   Has no children   Highest level of education: 11th grade   Unemployed - currently on SSI   Right handed   Drinks caffeine   Service dog    Social Drivers of Corporate Investment Banker Strain: Not on file  Food Insecurity: Not on file  Transportation Needs: Not on file  Physical Activity: Not on file  Stress: Not on file  Social Connections: Not on file  Intimate Partner Violence: Not on file     PHYSICAL EXAM: Vitals:   03/11/24 1418  BP: 135/75  Pulse: 86  SpO2: 94%   General: No acute distress Head:  Normocephalic/atraumatic Skin/Extremities: No rash, no edema Neurological Exam: alert and awake. No aphasia or dysarthria. Fund of knowledge is appropriate.  Attention and concentration are normal.   Cranial nerves: Right phthisis bulbi, left pupil round.  Extraocular movements intact on left with no nystagmus. Visual fields full on left eye.  No facial asymmetry.  Motor: There is again note of increased tone and cogwheeling bilaterally. Muscle strength 5/5 throughout with no pronator drift.   Finger to nose testing intact. Reflexes +1 throughout. No resting tremor. As he stands, there is again bobbing/bouncing bilateral leg tremor that gradually improves with ambulation, he takes small marching steps with slight tremor walking. Good finger and foot taps, RAMs.    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 Head CT unremarkable. His 1-hour EEG was normal. He has been seizure-free since 12/2020 on Levetiracetam  750mg  BID. He had post-ictal psychosis and has been on olanzapine , currently taking 5mg  BID with stable mood. He continues to have bilateral leg tremors, there was initial good response to Gabapentin  which helps with orthostatic tremor. We discussed increasing hydration. We discusses increasing Gabapentin  however they would like to work on increasing water  intake and if no improvement proceed with increase in Gabapentin  to 300mg  BID. He does not drive. Continue close supervision. They ask about medications for smoking cessation, advised to speak to PCP. Follow-up in 4 months, call for any changes.   Thank you for allowing me to participate in his care.  Please do not hesitate to call for any questions or concerns.    Darice Shivers, M.D.   CC: Dr. Sim

## 2024-06-23 ENCOUNTER — Ambulatory Visit: Admitting: Neurology
